# Patient Record
Sex: Female | Born: 1990 | Race: Asian | Hispanic: No | Marital: Single | Smoking: Never smoker
Health system: Southern US, Community
[De-identification: ages and names within clinical notes are randomized; demographics above are authoritative.]

## PROBLEM LIST (undated history)

## (undated) DIAGNOSIS — D649 Anemia, unspecified: Secondary | ICD-10-CM

## (undated) DIAGNOSIS — B999 Unspecified infectious disease: Secondary | ICD-10-CM

## (undated) DIAGNOSIS — G43909 Migraine, unspecified, not intractable, without status migrainosus: Secondary | ICD-10-CM

## (undated) DIAGNOSIS — K219 Gastro-esophageal reflux disease without esophagitis: Secondary | ICD-10-CM

---
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
## 2013-12-24 ENCOUNTER — Emergency Department (HOSPITAL_COMMUNITY)
Admission: EM | Admit: 2013-12-24 | Discharge: 2013-12-24 | Discharge status: Against Medical Advice | Payer: Medicaid Other | Attending: Emergency Medicine | Admitting: Emergency Medicine

## 2013-12-24 DIAGNOSIS — M069 Rheumatoid arthritis, unspecified: Secondary | ICD-10-CM | POA: Insufficient documentation

## 2013-12-24 DIAGNOSIS — R1110 Vomiting, unspecified: Secondary | ICD-10-CM | POA: Insufficient documentation

## 2013-12-24 DIAGNOSIS — R42 Dizziness and giddiness: Secondary | ICD-10-CM | POA: Insufficient documentation

## 2013-12-24 DIAGNOSIS — G43909 Migraine, unspecified, not intractable, without status migrainosus: Secondary | ICD-10-CM | POA: Insufficient documentation

## 2013-12-24 NOTE — ED Notes (Signed)
Patient left stating she was going home and would come back if she started feeling worse

## 2014-08-25 ENCOUNTER — Emergency Department (HOSPITAL_COMMUNITY)
Admission: EM | Admit: 2014-08-25 | Discharge: 2014-08-25 | Discharge status: Against Medical Advice | Payer: Medicaid Other | Attending: Emergency Medicine | Admitting: Emergency Medicine

## 2014-08-25 ENCOUNTER — Encounter (HOSPITAL_COMMUNITY): Payer: Self-pay | Admitting: Emergency Medicine

## 2014-08-25 DIAGNOSIS — R51 Headache: Secondary | ICD-10-CM | POA: Diagnosis not present

## 2014-08-25 HISTORY — DX: Migraine, unspecified, not intractable, without status migrainosus: G43.909

## 2014-08-25 NOTE — ED Notes (Signed)
Patient left without being seen due to wait. Patient encouraged to stay. Patient stated she would call her physician in the morning.

## 2014-08-25 NOTE — ED Notes (Signed)
Pt c/o frontal HA x 3 days with N/V

## 2019-03-19 ENCOUNTER — Other Ambulatory Visit: Payer: Self-pay | Admitting: *Deleted

## 2019-03-19 DIAGNOSIS — Z20822 Contact with and (suspected) exposure to covid-19: Secondary | ICD-10-CM

## 2019-03-20 LAB — NOVEL CORONAVIRUS, NAA: SARS-CoV-2, NAA: NOT DETECTED

## 2019-04-03 ENCOUNTER — Telehealth: Payer: Self-pay | Admitting: General Practice

## 2019-04-03 NOTE — Telephone Encounter (Signed)
Patient was advised of her negative COVID results. Patient expressed understanding. °

## 2019-06-05 ENCOUNTER — Ambulatory Visit (INDEPENDENT_AMBULATORY_CARE_PROVIDER_SITE_OTHER): Payer: Medicaid Other

## 2019-06-05 ENCOUNTER — Encounter (HOSPITAL_COMMUNITY): Payer: Self-pay

## 2019-06-05 ENCOUNTER — Other Ambulatory Visit: Payer: Self-pay

## 2019-06-05 ENCOUNTER — Ambulatory Visit (HOSPITAL_COMMUNITY)
Admission: EM | Admit: 2019-06-05 | Discharge: 2019-06-05 | Disposition: A | Discharge status: Home / Self Care | Payer: Medicaid Other | Attending: Family Medicine | Admitting: Family Medicine

## 2019-06-05 DIAGNOSIS — S62652A Nondisplaced fracture of medial phalanx of right middle finger, initial encounter for closed fracture: Secondary | ICD-10-CM | POA: Diagnosis not present

## 2019-06-05 DIAGNOSIS — Z3202 Encounter for pregnancy test, result negative: Secondary | ICD-10-CM

## 2019-06-05 LAB — POCT PREGNANCY, URINE: Preg Test, Ur: NEGATIVE

## 2019-06-05 NOTE — ED Triage Notes (Signed)
Pt states she fell a week ago on her tailbone. Pt jammed her middle finger as well ( right hand ).

## 2019-06-05 NOTE — Discharge Instructions (Addendum)
The x-ray shows a small chip fracture in the middle phalanx of your right middle finger.  This usually takes about 4 weeks to heal.  We would like you to follow-up with the hand surgeon indicated on this form.  You most likely will not need any surgery but rather just follow-up to make sure the bone is healing properly.

## 2019-06-05 NOTE — ED Provider Notes (Signed)
[facility]    CSN: [id number] Arrival date & time: 06/05/19  1615      History   Chief Complaint Chief Complaint  Patient presents with  . Fall    HPI Tricia Walter is a 28 y.o. female.   This is the initial visit for this 28 year old woman.  Patient had a fall and injured her right middle finger and left gluteal area.  She was at a wedding when she fell.  The right middle finger was hyperextended and swelled immediately.  After a while, the swelling subsided, but then she "jammed" it and has had pain in the PIP joint since with mild proximal middle finger STS.  She also fell on left sacral area and has tenderness and pain when sitting.     Past Medical History:  Diagnosis Date  . Migraine     There are no active problems to display for this patient.   History reviewed. No pertinent surgical history.  OB History   No obstetric history on file.      Home Medications    Prior to Admission medications   Not on File    Family History History reviewed. No pertinent family history.  Social History Social History   Tobacco Use  . Smoking status: Never Smoker  . Smokeless tobacco: Never Used  Substance Use Topics  . Alcohol use: Yes    Comment: occ  . Drug use: No     Allergies   Patient has no known allergies.   Review of Systems Review of Systems  Musculoskeletal: Positive for joint swelling.  All other systems reviewed and are negative.    Physical Exam Triage Vital Signs ED Triage Vitals  Enc Vitals Group     BP      Pulse      Resp      Temp      Temp src      SpO2      Weight      Height      Head Circumference      Peak Flow      Pain Score      Pain Loc      Pain Edu?      Excl. in [place]?    No data found.  Updated Vital Signs BP 114/72 (BP Location: Right Arm)   Pulse 76   Temp 98.7 F (37.1 C) (Oral)   Resp 16   Wt 70.3 kg   LMP 03/05/2019 (Exact Date) Comment: patient stated she had an abortion in  July/2020, has not had LMP since . Pregnancy test was Negative  SpO2 100%    Physical Exam Vitals signs and nursing note reviewed.  Constitutional:      Appearance: Normal appearance. She is normal weight.  HENT:     Head: Normocephalic.  Eyes:     Conjunctiva/sclera: Conjunctivae normal.  Neck:     Musculoskeletal: Normal range of motion and neck supple.  Pulmonary:     Effort: Pulmonary effort is normal.  Musculoskeletal: Normal range of motion.        General: Swelling, tenderness and signs of injury present. No deformity.     Comments: Right middle finger shows mild swelling and tenderness at the PIP joint bilaterally.  There is no ecchymosis or deformity.  Patient has tenderness at the insertion of the gluteus maximus along the left sacral edge.  There is no ecchymosis there.  Skin:    General: Skin is warm and  dry.     Findings: No bruising.  Neurological:     General: No focal deficit present.     Mental Status: She is alert and oriented to person, place, and time.  Psychiatric:        Mood and Affect: Mood normal.        Behavior: Behavior normal.        Thought Content: Thought content normal.        Judgment: Judgment normal.      UC Treatments / Results  Labs (all labs ordered are listed, but only abnormal results are displayed) Labs Reviewed  POCT PREGNANCY, URINE    EKG   Radiology No results found.  Procedures Procedures (including critical care time)  Medications Ordered in UC Medications - No data to display  Initial Impression / Assessment and Plan / UC Course  I have reviewed the triage vital signs and the nursing notes.  Pertinent labs & imaging results that were available during my care of the patient were reviewed by me and considered in my medical decision making (see chart for details).    Final Clinical Impressions(s) / UC Diagnoses   Final diagnoses:  Nondisplaced fracture of middle phalanx of right middle finger, initial  encounter for closed fracture     Discharge Instructions     The x-ray shows a small chip fracture in the middle phalanx of your right middle finger.  This usually takes about 4 weeks to heal.  We would like you to follow-up with the hand surgeon indicated on this form.  You most likely will not need any surgery but rather just follow-up to make sure the bone is healing properly.    ED Prescriptions    None     I have reviewed the PDMP during this encounter.   Elvina Sidle, MD 06/05/19 1750

## 2019-06-23 ENCOUNTER — Encounter (HOSPITAL_COMMUNITY): Payer: Self-pay | Admitting: Emergency Medicine

## 2019-06-23 ENCOUNTER — Emergency Department (HOSPITAL_COMMUNITY)
Admission: EM | Admit: 2019-06-23 | Discharge: 2019-06-23 | Disposition: A | Discharge status: Home / Self Care | Payer: Medicaid Other | Attending: Emergency Medicine | Admitting: Emergency Medicine

## 2019-06-23 DIAGNOSIS — R229 Localized swelling, mass and lump, unspecified: Secondary | ICD-10-CM

## 2019-06-23 DIAGNOSIS — R220 Localized swelling, mass and lump, head: Secondary | ICD-10-CM | POA: Insufficient documentation

## 2019-06-23 NOTE — ED Notes (Signed)
Pt given dc instructions pt verbalizes understanding.  

## 2019-06-23 NOTE — ED Triage Notes (Signed)
Pt here with a poss abscess on the back of her head on left side. Pt has been told for the past 4 years to get checked out and today she decided to get it looked at. Pt is in no pain.

## 2019-06-23 NOTE — Discharge Instructions (Signed)
As we discussed you will need follow-up evaluation with a primary care provider for ongoing evaluation and management of your swelling.  Is important that you make this follow-up visit within the next several weeks, please return to the emergency room if you develop any new or worsening signs or symptoms.

## 2019-06-23 NOTE — ED Provider Notes (Signed)
[facility] EMERGENCY DEPARTMENT Provider Note   CSN: [id number] Arrival date & time: 06/23/19  0908     History   Chief Complaint Chief Complaint  Patient presents with  . Abscess    HPI Tricia Walter is a 28 y.o. female.     HPI   28 year old female presents today with complaints of mass.  Patient notes that 4 years ago she noticed a soft lump on her scalp.  She notes this is unchanged is nonpainful.  She notes her family encouraged her to have it evaluated today, no significant changes.  Past Medical History:  Diagnosis Date  . Migraine     There are no active problems to display for this patient.   History reviewed. No pertinent surgical history.   OB History   No obstetric history on file.      Home Medications    Prior to Admission medications   Not on File    Family History No family history on file.  Social History Social History   Tobacco Use  . Smoking status: Never Smoker  . Smokeless tobacco: Never Used  Substance Use Topics  . Alcohol use: Yes    Comment: occ  . Drug use: No     Allergies   Patient has no known allergies.   Review of Systems Review of Systems  All other systems reviewed and are negative.    Physical Exam Updated Vital Signs BP 106/69 (BP Location: Right Arm)   Pulse 73   Temp 98.7 F (37.1 C) (Oral)   Resp 20   SpO2 99%   Physical Exam Vitals signs and nursing note reviewed.  Constitutional:      Appearance: She is well-developed.  HENT:     Head: Normocephalic and atraumatic.     Comments: Approximately 1 cm of soft tissue noted to the left posterior scalp, no overlying erythema, nontender Eyes:     General: No scleral icterus.       Right eye: No discharge.        Left eye: No discharge.     Conjunctiva/sclera: Conjunctivae normal.     Pupils: Pupils are equal, round, and reactive to light.  Neck:     Musculoskeletal: Normal range of motion.     Vascular: No JVD.   Trachea: No tracheal deviation.  Pulmonary:     Effort: Pulmonary effort is normal.     Breath sounds: No stridor.  Neurological:     Mental Status: She is alert and oriented to person, place, and time.     Coordination: Coordination normal.  Psychiatric:        Behavior: Behavior normal.        Thought Content: Thought content normal.        Judgment: Judgment normal.      ED Treatments / Results  Labs (all labs ordered are listed, but only abnormal results are displayed) Labs Reviewed - No data to display  EKG None  Radiology No results found.  Procedures Procedures (including critical care time)  Medications Ordered in ED Medications - No data to display   Initial Impression / Assessment and Plan / ED Course  I have reviewed the triage vital signs and the nursing notes.  Pertinent labs & imaging results that were available during my care of the patient were reviewed by me and considered in my medical decision making (see chart for details).        28 year old female presents today with soft  tissue swelling to her scalp.  This is been going on for 4 years it is unchanged is nonpainful.  Question lipoma.  I have instructed the patient to follow-up with primary care for reevaluation and ongoing management of this.  She agrees she will follow-up as an outpatient for reevaluation.  Final Clinical Impressions(s) / ED Diagnoses   Final diagnoses:  Soft tissue swelling    ED Discharge Orders    None       Okey Regal, PA-C 06/23/19 7416    Lajean Saver, MD 06/23/19 1027

## 2019-07-02 ENCOUNTER — Inpatient Hospital Stay: Payer: Medicaid Other

## 2019-07-03 ENCOUNTER — Emergency Department (HOSPITAL_BASED_OUTPATIENT_CLINIC_OR_DEPARTMENT_OTHER): Payer: Medicaid Other

## 2019-07-03 ENCOUNTER — Other Ambulatory Visit: Payer: Self-pay

## 2019-07-03 ENCOUNTER — Emergency Department (HOSPITAL_BASED_OUTPATIENT_CLINIC_OR_DEPARTMENT_OTHER)
Admission: EM | Admit: 2019-07-03 | Discharge: 2019-07-03 | Disposition: A | Discharge status: Home / Self Care | Payer: Medicaid Other | Attending: Emergency Medicine | Admitting: Emergency Medicine

## 2019-07-03 ENCOUNTER — Encounter (HOSPITAL_BASED_OUTPATIENT_CLINIC_OR_DEPARTMENT_OTHER): Payer: Self-pay | Admitting: *Deleted

## 2019-07-03 DIAGNOSIS — K219 Gastro-esophageal reflux disease without esophagitis: Secondary | ICD-10-CM | POA: Diagnosis not present

## 2019-07-03 DIAGNOSIS — R110 Nausea: Secondary | ICD-10-CM | POA: Diagnosis not present

## 2019-07-03 DIAGNOSIS — R197 Diarrhea, unspecified: Secondary | ICD-10-CM | POA: Insufficient documentation

## 2019-07-03 DIAGNOSIS — R1011 Right upper quadrant pain: Secondary | ICD-10-CM

## 2019-07-03 LAB — COMPREHENSIVE METABOLIC PANEL
ALT: 14 U/L (ref 0–44)
AST: 18 U/L (ref 15–41)
Albumin: 4.5 g/dL (ref 3.5–5.0)
Alkaline Phosphatase: 53 U/L (ref 38–126)
Anion gap: 11 (ref 5–15)
BUN: 12 mg/dL (ref 6–20)
CO2: 23 mmol/L (ref 22–32)
Calcium: 9 mg/dL (ref 8.9–10.3)
Chloride: 104 mmol/L (ref 98–111)
Creatinine, Ser: 0.76 mg/dL (ref 0.44–1.00)
GFR calc Af Amer: >60 mL/min (ref >60)
GFR calc non Af Amer: >60 mL/min (ref >60)
Glucose, Bld: 81 mg/dL (ref 70–99)
Potassium: 3.8 mmol/L (ref 3.5–5.1)
Sodium: 138 mmol/L (ref 135–145)
Total Bilirubin: 1.9 mg/dL — ABNORMAL HIGH (ref 0.3–1.2)
Total Protein: 8.3 g/dL — ABNORMAL HIGH (ref 6.5–8.1)

## 2019-07-03 LAB — CBC WITH DIFFERENTIAL/PLATELET
Abs Immature Granulocytes: 0.01 10*3/uL (ref 0.00–0.07)
Basophils Absolute: 0 10*3/uL (ref 0.0–0.1)
Basophils Relative: 0 %
Eosinophils Absolute: 0 10*3/uL (ref 0.0–0.5)
Eosinophils Relative: 0 %
HCT: 41.6 % (ref 36.0–46.0)
Hemoglobin: 13.6 g/dL (ref 12.0–15.0)
Immature Granulocytes: 0 %
Lymphocytes Relative: 30 %
Lymphs Abs: 1.6 10*3/uL (ref 0.7–4.0)
MCH: 31.3 pg (ref 26.0–34.0)
MCHC: 32.7 g/dL (ref 30.0–36.0)
MCV: 95.9 fL (ref 80.0–100.0)
Monocytes Absolute: 0.5 10*3/uL (ref 0.1–1.0)
Monocytes Relative: 10 %
Neutro Abs: 3.3 10*3/uL (ref 1.7–7.7)
Neutrophils Relative %: 60 %
Platelets: 240 10*3/uL (ref 150–400)
RBC: 4.34 MIL/uL (ref 3.87–5.11)
RDW: 12.6 % (ref 11.5–15.5)
WBC: 5.5 10*3/uL (ref 4.0–10.5)
nRBC: 0 % (ref 0.0–0.2)

## 2019-07-03 LAB — URINALYSIS, ROUTINE W REFLEX MICROSCOPIC
Bilirubin Urine: NEGATIVE
Glucose, UA: NEGATIVE mg/dL
Hgb urine dipstick: NEGATIVE
Ketones, ur: NEGATIVE mg/dL
Leukocytes,Ua: NEGATIVE
Nitrite: NEGATIVE
Protein, ur: NEGATIVE mg/dL
Specific Gravity, Urine: 1.02 (ref 1.005–1.030)
pH: 6.5 (ref 5.0–8.0)

## 2019-07-03 LAB — PREGNANCY, URINE: Preg Test, Ur: NEGATIVE

## 2019-07-03 LAB — LIPASE, BLOOD: Lipase: 35 U/L (ref 11–51)

## 2019-07-03 MED ORDER — FAMOTIDINE 20 MG PO TABS
20.0000 mg | ORAL_TABLET | Freq: Once | ORAL | Status: AC
  Administered 2019-07-03: 20 mg via ORAL
  Filled 2019-07-03: qty 1

## 2019-07-03 MED ORDER — ACETAMINOPHEN 500 MG PO TABS: 500.0000 mg | ORAL_TABLET | Freq: Four times a day (QID) | ORAL | 0 refills | Status: AC | PRN

## 2019-07-03 MED ORDER — ONDANSETRON HCL 4 MG PO TABS: 4.0000 mg | ORAL_TABLET | Freq: Three times a day (TID) | ORAL | 0 refills | Status: DC | PRN

## 2019-07-03 MED ORDER — IOHEXOL 300 MG/ML  SOLN
100.0000 mL | Freq: Once | INTRAMUSCULAR | Status: AC | PRN
  Administered 2019-07-03: 100 mL via INTRAVENOUS

## 2019-07-03 MED ORDER — LIDOCAINE VISCOUS HCL 2 % MT SOLN
15.0000 mL | Freq: Once | OROMUCOSAL | Status: AC
  Administered 2019-07-03: 15 mL via ORAL
  Filled 2019-07-03: qty 15

## 2019-07-03 MED ORDER — FAMOTIDINE 20 MG PO TABS: 20.0000 mg | ORAL_TABLET | Freq: Two times a day (BID) | ORAL | 0 refills | Status: DC

## 2019-07-03 MED ORDER — MORPHINE SULFATE (PF) 4 MG/ML IV SOLN
4.0000 mg | Freq: Once | INTRAVENOUS | Status: AC
  Administered 2019-07-03: 4 mg via INTRAVENOUS
  Filled 2019-07-03: qty 1

## 2019-07-03 MED ORDER — ONDANSETRON HCL 4 MG/2ML IJ SOLN
4.0000 mg | Freq: Once | INTRAMUSCULAR | Status: AC
  Administered 2019-07-03: 4 mg via INTRAVENOUS
  Filled 2019-07-03: qty 2

## 2019-07-03 MED ORDER — ALUM & MAG HYDROXIDE-SIMETH 200-200-20 MG/5ML PO SUSP
30.0000 mL | Freq: Once | ORAL | Status: AC
  Administered 2019-07-03: 30 mL via ORAL
  Filled 2019-07-03: qty 30

## 2019-07-03 NOTE — Discharge Instructions (Signed)
1. Medications: Take Zofran as needed for nausea.  This medicine dissolve under your tongue and wait around 10 to 20 minutes before eating or drinking after taking this medication.  Take Pepcid twice daily with meals.  Can take Tylenol 1 to 2 tablets every 6 hours as needed for pain. 2. Treatment: rest, drink plenty of fluids, advance diet slowly.  Eat a diet of bland foods that will not upset your stomach.  Avoid spicy foods, fried foods, fatty foods, or alcohol. 3. Follow Up: Please followup with your primary doctor or gastroenterologist in 3 days for discussion of your diagnoses and further evaluation after today's visit; if you do not have a primary care doctor use the resource guide provided to find one; Please return to the ER for persistent vomiting, high fevers or worsening symptoms

## 2019-07-03 NOTE — ED Notes (Signed)
ED Provider at bedside. 

## 2019-07-03 NOTE — ED Triage Notes (Signed)
She was seen at UC this am for epigastric pain. Stiffness between her shoulder blades. She was sent here to r.o gallstones.

## 2019-07-03 NOTE — ED Provider Notes (Signed)
[facility] EMERGENCY DEPARTMENT Provider Note   CSN: [id number] Arrival date & time: 07/03/19  1210     History   Chief Complaint Chief Complaint  Patient presents with   Abdominal Pain    HPI Tricia Walter is a 28 y.o. female with history of migraines presents sent from urgent care for evaluation of acute onset, progressively worsening right upper quadrant abdominal pain.  Reports symptoms began yesterday evening around 10 PM primarily with nausea but some right upper quadrant abdominal discomfort.  She took an over-the-counter nausea medication and ibuprofen and was able to fall asleep but when she awoke this morning her pain had significantly worsened.  She notes a constant pain which is sometimes sharp but will subside intermittently.  Pain does not radiate but she also notes some discomfort to her right shoulder blade.  Denies vomiting, diarrhea or constipation but she did have some soft stools last night.  Denies melena, hematochezia, hematemesis, urinary symptoms, shortness of breath, or chest pain.  No fevers or chills.  Was seen at urgent care earlier today and was sent to the ED for evaluation of possible gallstones or other hepatobiliary disease.     The history is provided by the patient.    Past Medical History:  Diagnosis Date   Migraine     There are no active problems to display for this patient.   Past Surgical History:  Procedure Laterality Date   CESAREAN SECTION       OB History   No obstetric history on file.      Home Medications    Prior to Admission medications   Medication Sig Start Date End Date Taking? Authorizing Provider  acetaminophen (TYLENOL) 500 MG tablet Take 1 tablet (500 mg total) by mouth every 6 (six) hours as needed. 07/03/19   Zadiel Leyh A, PA-C  famotidine (PEPCID) 20 MG tablet Take 1 tablet (20 mg total) by mouth 2 (two) times daily. 07/03/19   Torrence Branagan A, PA-C  ondansetron (ZOFRAN) 4 MG tablet Take 1  tablet (4 mg total) by mouth every 8 (eight) hours as needed for nausea or vomiting. 07/03/19   Jeanie SewerFawze, Christella App A, PA-C    Family History No family history on file.  Social History Social History   Tobacco Use   Smoking status: Never Smoker   Smokeless tobacco: Never Used  Substance Use Topics   Alcohol use: Yes    Comment: occ   Drug use: No     Allergies   Patient has no known allergies.   Review of Systems Review of Systems  Constitutional: Negative for chills and fever.  Respiratory: Negative for shortness of breath.   Cardiovascular: Negative for chest pain.  Gastrointestinal: Positive for abdominal pain, diarrhea and nausea. Negative for constipation and vomiting.  Genitourinary: Negative for dysuria, frequency, hematuria and urgency.  All other systems reviewed and are negative.    Physical Exam Updated Vital Signs BP 104/65 (BP Location: Right Arm)    Pulse 77    Temp 99.3 F (37.4 C) (Oral)    Resp 16    Ht 5\' 3"  (1.6 m)    Wt 68 kg    LMP 06/19/2019    SpO2 100%    BMI 26.57 kg/m   Physical Exam Vitals signs and nursing note reviewed.  Constitutional:      General: She is not in acute distress.    Appearance: She is well-developed.  HENT:     Head: Normocephalic and atraumatic.  Eyes:     General:        Right eye: No discharge.        Left eye: No discharge.     Conjunctiva/sclera: Conjunctivae normal.  Neck:     Vascular: No JVD.     Trachea: No tracheal deviation.  Cardiovascular:     Rate and Rhythm: Normal rate and regular rhythm.  Pulmonary:     Effort: Pulmonary effort is normal.     Breath sounds: Normal breath sounds.     Comments: Mild tenderness to palpation of the right anterior/inferior chest wall with no deformity, crepitus, ecchymosis, or flail segment. Chest:    Abdominal:     General: Abdomen is flat. Bowel sounds are normal. There is no distension.     Palpations: Abdomen is soft.     Tenderness: There is abdominal  tenderness in the right upper quadrant. There is no right CVA tenderness, left CVA tenderness, guarding or rebound. Positive signs include Murphy's sign.  Skin:    General: Skin is warm and dry.     Findings: No erythema.  Neurological:     Mental Status: She is alert.  Psychiatric:        Behavior: Behavior normal.      ED Treatments / Results  Labs (all labs ordered are listed, but only abnormal results are displayed) Labs Reviewed  COMPREHENSIVE METABOLIC PANEL - Abnormal; Notable for the following components:      Result Value   Total Protein 8.3 (*)    Total Bilirubin 1.9 (*)    All other components within normal limits  PREGNANCY, URINE  CBC WITH DIFFERENTIAL/PLATELET  LIPASE, BLOOD  URINALYSIS, ROUTINE W REFLEX MICROSCOPIC    EKG None  Radiology Dg Chest 2 View  Result Date: 07/03/2019 CLINICAL DATA:  28 year old female with history of right-sided chest pain. Nausea and vomiting for 1-2 days. EXAM: CHEST - 2 VIEW COMPARISON:  No priors. FINDINGS: Lung volumes are normal. No consolidative airspace disease. No pleural effusions. No pneumothorax. No pulmonary nodule or mass noted. Pulmonary vasculature and the cardiomediastinal silhouette are within normal limits. Bilateral nipple rings are incidentally noted. IMPRESSION: 1.  No radiographic evidence of acute cardiopulmonary disease. Electronically Signed   By: Vinnie Langton M.D.   On: 07/03/2019 16:08   Ct Abdomen Pelvis W Contrast  Result Date: 07/03/2019 CLINICAL DATA:  Abdominal pain, primarily right-sided.  Nausea. EXAM: CT ABDOMEN AND PELVIS WITH CONTRAST TECHNIQUE: Multidetector CT imaging of the abdomen and pelvis was performed using the standard protocol following bolus administration of intravenous contrast. Oral contrast was also administered. CONTRAST:  141mL OMNIPAQUE IOHEXOL 300 MG/ML  SOLN COMPARISON:  Ultrasound right upper quadrant July 03, 2019 FINDINGS: Lower chest: There is slight bibasilar  atelectatic change. There is no edema or consolidation. Oral contrast is seen in the distal esophagus. Hepatobiliary: No focal liver lesions are appreciable. The gallbladder wall does not appear appreciably thickened. There is no biliary duct dilatation. Pancreas: There is no pancreatic mass or inflammatory focus. Spleen: No splenic lesions are evident. Adrenals/Urinary Tract: Adrenals bilaterally appear unremarkable. Kidneys bilaterally show no evident mass or hydronephrosis. There is no evident renal or ureteral calculus on either side. Urinary bladder is midline with wall thickness within normal limits. Stomach/Bowel: There is no appreciable bowel wall or mesenteric thickening. There is no appreciable bowel obstruction. The terminal ileum appears unremarkable. There is no demonstrable free air or portal venous air. Vascular/Lymphatic: There is no abdominal aortic aneurysm. No vascular lesions are  evident. There is no appreciable adenopathy in the abdomen or pelvis. Reproductive: The uterus is anteverted. There is no evident pelvic mass. There is trace free fluid in the cul-de-sac. Other: Appendix appears normal. There is no abscess in the abdomen pelvis. There is no ascites beyond trace fluid in the cul-de-sac region. Musculoskeletal: There are no blastic or lytic bone lesions. There is no intramuscular or abdominal wall lesion. IMPRESSION: 1. Oral contrast is seen in the distal esophagus, a finding indicative of a degree of gastroesophageal reflux. 2. No bowel wall thickening or bowel obstruction evident. No abscess in the abdomen or pelvis. Appendix appears normal. 3.  Gallbladder appears unremarkable by CT. 4.  Trace free fluid in the cul-de-sac which may be physiologic. 5. No renal or ureteral calculus. No hydronephrosis. Urinary bladder wall thickness is within normal limits. Electronically Signed   By: Bretta BangWilliam  Woodruff III M.D.   On: 07/03/2019 16:05   [place] Abdomen Limited Ruq  Result Date:  07/03/2019 CLINICAL DATA:  RIGHT upper quadrant pain for 1 day. Intermittent pain EXAM: ULTRASOUND ABDOMEN LIMITED RIGHT UPPER QUADRANT COMPARISON:  None. FINDINGS: Gallbladder: No gallstones or wall thickening visualized. No sonographic Murphy sign noted by sonographer. Common bile duct: Diameter: 2.4 mm Liver: No focal lesion identified. Within normal limits in parenchymal echogenicity. Portal vein is patent on color Doppler imaging with normal direction of blood flow towards the liver. Other: No ascites IMPRESSION: Normal RIGHT upper quadrant ultrasound. Electronically Signed   By: Genevive BiStewart  Edmunds M.D.   On: 07/03/2019 14:10     Procedures Procedures (including critical care time)  Medications Ordered in ED Medications  ondansetron (ZOFRAN) injection 4 mg (4 mg Intravenous Given 07/03/19 1329)  morphine 4 MG/ML injection 4 mg (4 mg Intravenous Given 07/03/19 1329)  iohexol (OMNIPAQUE) 300 MG/ML solution 100 mL (100 mLs Intravenous Contrast Given 07/03/19 1543)  alum & mag hydroxide-simeth (MAALOX/MYLANTA) 200-200-20 MG/5ML suspension 30 mL (30 mLs Oral Given 07/03/19 1626)    And  lidocaine (XYLOCAINE) 2 % viscous mouth solution 15 mL (15 mLs Oral Given 07/03/19 1626)  famotidine (PEPCID) tablet 20 mg (20 mg Oral Given 07/03/19 1626)     Initial Impression / Assessment and Plan / ED Course  I have reviewed the triage vital signs and the nursing notes.  Pertinent labs & imaging results that were available during my care of the patient were reviewed by me and considered in my medical decision making (see chart for details).        Patient presenting for evaluation of right upper quadrant abdominal pain and right chest wall pain with associated nausea.  She is afebrile, vital signs are stable.  She is nontoxic in appearance.  No peritoneal signs on examination of the abdomen.  Sent from urgent care to rule out hepatobiliary dysfunction.  Lab work reviewed by me shows no leukocytosis, no  anemia, no metabolic derangements, no renal insufficiency.  Total bilirubin is very mildly elevated but remainder of LFTs are within normal limits.  Lipase is within normal limits.  UA does not suggest UTI or nephrolithiasis.  Imaging today shows no evidence of acute surgical abdominal pathology, no evidence of obstruction, perforation, appendicitis, cholecystitis.  No lower abdominal pain or GU complaints to suggest ectopic pregnancy, PID, ovarian torsion, or TOA.  Mild chest wall tenderness, could be costochondritis or musculoskeletal in etiology, doubt PE, ACS or MI.  On reevaluation patient is resting comfortably no apparent distress.  She reports she is feeling better and is tolerating  p.o. food and fluids without difficulty.  Serial abdominal examinations are benign.  No further emergent work-up required at this time.  Recommend follow-up with PCP for reevaluation of symptoms.  Discussed strict ED return precautions. Patient verbalized understanding of and agreement with plan and is safe for discharge home at this time.   Final Clinical Impressions(s) / ED Diagnoses   Final diagnoses:  RUQ pain  Gastroesophageal reflux disease, unspecified whether esophagitis present    ED Discharge Orders         Ordered    ondansetron (ZOFRAN) 4 MG tablet  Every 8 hours PRN     07/03/19 1655    famotidine (PEPCID) 20 MG tablet  2 times daily     07/03/19 1655    acetaminophen (TYLENOL) 500 MG tablet  Every 6 hours PRN     07/03/19 1655           Nohea Kras, [place] A, PA-C 07/07/19 1348    Alvira Monday, MD 07/11/19 1310

## 2019-07-22 ENCOUNTER — Other Ambulatory Visit: Payer: Self-pay

## 2019-07-22 ENCOUNTER — Ambulatory Visit: Payer: Medicaid Other | Admitting: Critical Care Medicine

## 2019-07-22 NOTE — Progress Notes (Deleted)
Patient ID: Tricia Walter, female   DOB: [birth year], 28 y.o.   MRN: [id number] Virtual Visit via Telephone Note  I connected with Tricia Walter on 07/22/19 at  9:00 AM EST by telephone and verified that I am speaking with the correct person using two identifiers.   Consent:  I discussed the limitations, risks, security and privacy concerns of performing an evaluation and management service by telephone and the availability of in person appointments. I also discussed with the patient that there may be a patient responsible charge related to this service. The patient expressed understanding and agreed to proceed.  Location of patient:  Location of provider:  Persons participating in the televisit with the patient.     History of Present Illness: Pt in ED 10/15: Tricia Walter is a 28 y.o. female with history of migraines presents sent from urgent care for evaluation of acute onset, progressively worsening right upper quadrant abdominal pain.  Reports symptoms began yesterday evening around 10 PM primarily with nausea but some right upper quadrant abdominal discomfort.  She took an over-the-counter nausea medication and ibuprofen and was able to fall asleep but when she awoke this morning her pain had significantly worsened.  She notes a constant pain which is sometimes sharp but will subside intermittently.  Pain does not radiate but she also notes some discomfort to her right shoulder blade.  Denies vomiting, diarrhea or constipation but she did have some soft stools last night.  Denies melena, hematochezia, hematemesis, urinary symptoms, shortness of breath, or chest pain.  No fevers or chills.  Was seen at urgent care earlier today and was sent to the ED for evaluation of possible gallstones or other hepatobiliary disease.   Patient presenting for evaluation of right upper quadrant abdominal pain and right chest wall pain with associated nausea.  She is afebrile, vital signs are stable.  She  is nontoxic in appearance.  No peritoneal signs on examination of the abdomen.  Sent from urgent care to rule out hepatobiliary dysfunction.  Lab work reviewed by me shows no leukocytosis, no anemia, no metabolic derangements, no renal insufficiency.  Total bilirubin is very mildly elevated but remainder of LFTs are within normal limits.  Lipase is within normal limits.  UA does not suggest UTI or nephrolithiasis.  Imaging today shows no evidence of acute surgical abdominal pathology, no evidence of obstruction, perforation, appendicitis, cholecystitis.  No lower abdominal pain or GU complaints to suggest ectopic pregnancy, PID, ovarian torsion, or TOA.  Mild chest wall tenderness, could be costochondritis or musculoskeletal in etiology, doubt PE, ACS or MI.  On reevaluation patient is resting comfortably no apparent distress.  She reports she is feeling better and is tolerating p.o. food and fluids without difficulty.  Serial abdominal examinations are benign.  No further emergent work-up required at this time.  Recommend follow-up with PCP for reevaluation of symptoms.  Discussed strict ED return precautions. Patient verbalized understanding of and agreement with plan and is safe for discharge home at this time.   Rx pepcid/ zofran 8mg   Tylenol   F/u Telephone visit:  Saw PCP [facility] 10/30  Rx OmepraZole       Constitutional:   No  weight loss, night sweats,  Fevers, chills, fatigue, lassitude. HEENT:   No headaches,  Difficulty swallowing,  Tooth/dental problems,  Sore throat,                No sneezing, itching, ear ache, nasal congestion, post nasal drip,  CV:  No chest pain,  Orthopnea, PND, swelling in lower extremities, anasarca, dizziness, palpitations  GI  No heartburn, indigestion, abdominal pain, nausea, vomiting, diarrhea, change in bowel habits, loss of appetite  Resp: No shortness of breath with exertion or at rest.  No excess mucus, no productive cough,  No  non-productive cough,  No coughing up of blood.  No change in color of mucus.  No wheezing.  No chest wall deformity  Skin: no rash or lesions.  GU: no dysuria, change in color of urine, no urgency or frequency.  No flank pain.  MS:  No joint pain or swelling.  No decreased range of motion.  No back pain.  Psych:  No change in mood or affect. No depression or anxiety.  No memory loss.  Observations/Objective: CT Abd 10/15: IMPRESSION: 1. Oral contrast is seen in the distal esophagus, a finding indicative of a degree of gastroesophageal reflux.  2. No bowel wall thickening or bowel obstruction evident. No abscess in the abdomen or pelvis. Appendix appears normal.  3. Gallbladder appears unremarkable by CT.  4. Trace free fluid in the cul-de-sac which may be physiologic.  5. No renal or ureteral calculus. No hydronephrosis. Urinary bladder wall thickness is within normal limits.   U/s RUQ 10/15:  IMPRESSION: Normal RIGHT upper quadrant ultrasound.   CXR 10/15: NAD   CMET /CBC Normal   Lipase Normal   Assessment and Plan:   Follow Up Instructions:    I discussed the assessment and treatment plan with the patient. The patient was provided an opportunity to ask questions and all were answered. The patient agreed with the plan and demonstrated an understanding of the instructions.   The patient was advised to call back or seek an in-person evaluation if the symptoms worsen or if the condition fails to improve as anticipated.  I provided *** minutes of non-face-to-face time during this encounter  including  median intraservice time , review of notes, labs, imaging, medications  and explaining diagnosis and management to the patient .    Shan Levans, MD

## 2019-07-24 ENCOUNTER — Inpatient Hospital Stay: Payer: Medicaid Other | Admitting: Family Medicine

## 2020-04-08 ENCOUNTER — Inpatient Hospital Stay (HOSPITAL_COMMUNITY)
Admission: EM | Admit: 2020-04-08 | Discharge: 2020-04-08 | Disposition: A | Discharge status: Home / Self Care | Payer: BLUE CROSS/BLUE SHIELD | Attending: Obstetrics & Gynecology | Admitting: Obstetrics & Gynecology

## 2020-04-08 ENCOUNTER — Encounter (HOSPITAL_COMMUNITY): Payer: Self-pay | Admitting: Obstetrics & Gynecology

## 2020-04-08 ENCOUNTER — Inpatient Hospital Stay (HOSPITAL_COMMUNITY): Payer: BLUE CROSS/BLUE SHIELD

## 2020-04-08 ENCOUNTER — Other Ambulatory Visit: Payer: Self-pay

## 2020-04-08 DIAGNOSIS — Z79899 Other long term (current) drug therapy: Secondary | ICD-10-CM | POA: Diagnosis not present

## 2020-04-08 DIAGNOSIS — Z3491 Encounter for supervision of normal pregnancy, unspecified, first trimester: Secondary | ICD-10-CM

## 2020-04-08 DIAGNOSIS — O208 Other hemorrhage in early pregnancy: Secondary | ICD-10-CM | POA: Insufficient documentation

## 2020-04-08 DIAGNOSIS — R109 Unspecified abdominal pain: Secondary | ICD-10-CM | POA: Diagnosis not present

## 2020-04-08 DIAGNOSIS — Z3A08 8 weeks gestation of pregnancy: Secondary | ICD-10-CM

## 2020-04-08 DIAGNOSIS — O26891 Other specified pregnancy related conditions, first trimester: Secondary | ICD-10-CM

## 2020-04-08 DIAGNOSIS — O26899 Other specified pregnancy related conditions, unspecified trimester: Secondary | ICD-10-CM

## 2020-04-08 DIAGNOSIS — R110 Nausea: Secondary | ICD-10-CM

## 2020-04-08 LAB — URINALYSIS, ROUTINE W REFLEX MICROSCOPIC
Bilirubin Urine: NEGATIVE
Glucose, UA: NEGATIVE mg/dL
Hgb urine dipstick: NEGATIVE
Ketones, ur: NEGATIVE mg/dL
Leukocytes,Ua: NEGATIVE
Nitrite: NEGATIVE
Protein, ur: NEGATIVE mg/dL
Specific Gravity, Urine: 1.017 (ref 1.005–1.030)
pH: 6 (ref 5.0–8.0)

## 2020-04-08 LAB — CBC
HCT: 37.7 % (ref 36.0–46.0)
Hemoglobin: 12.6 g/dL (ref 12.0–15.0)
MCH: 31 pg (ref 26.0–34.0)
MCHC: 33.4 g/dL (ref 30.0–36.0)
MCV: 92.6 fL (ref 80.0–100.0)
Platelets: 212 10*3/uL (ref 150–400)
RBC: 4.07 MIL/uL (ref 3.87–5.11)
RDW: 12.8 % (ref 11.5–15.5)
WBC: 7 10*3/uL (ref 4.0–10.5)
nRBC: 0 % (ref 0.0–0.2)

## 2020-04-08 LAB — WET PREP, GENITAL
Sperm: NONE SEEN
Trich, Wet Prep: NONE SEEN
Yeast Wet Prep HPF POC: NONE SEEN

## 2020-04-08 LAB — HCG, QUANTITATIVE, PREGNANCY: hCG, Beta Chain, Quant, S: 146891 m[IU]/mL — ABNORMAL HIGH (ref <5)

## 2020-04-08 LAB — POC URINE PREG, ED: Preg Test, Ur: POSITIVE — AB

## 2020-04-08 LAB — ABO/RH: ABO/RH(D): O POS

## 2020-04-08 MED ORDER — PROMETHAZINE HCL 25 MG PO TABS: 25.0000 mg | ORAL_TABLET | Freq: Four times a day (QID) | ORAL | 0 refills | Status: DC | PRN

## 2020-04-08 NOTE — ED Triage Notes (Signed)
Pt arrives to ED w/ 8/10 abdominal cramping. Pt endorses intermittent vaginal bleeding over the last 7 weeks. Pt is [redacted] weeks pregnant.

## 2020-04-08 NOTE — Discharge Instructions (Signed)
Return to care   If you have heavier bleeding that soaks through more that 2 pads per hour for an hour or more  If you bleed so much that you feel like you might pass out or you do pass out  If you have significant abdominal pain that is not improved with Tylenol       Safe Medications in Pregnancy   Acne: Benzoyl Peroxide Salicylic Acid  Backache/Headache: Tylenol: 2 regular strength every 4 hours OR              2 Extra strength every 6 hours  Colds/Coughs/Allergies: Benadryl (alcohol free) 25 mg every 6 hours as needed Breath right strips Claritin Cepacol throat lozenges Chloraseptic throat spray Cold-Eeze- up to three times per day Cough drops, alcohol free Flonase (by prescription only) Guaifenesin Mucinex Robitussin DM (plain only, alcohol free) Saline nasal spray/drops Sudafed (pseudoephedrine) & Actifed ** use only after [redacted] weeks gestation and if you do not have high blood pressure Tylenol Vicks Vaporub Zinc lozenges Zyrtec   Constipation: Colace Ducolax suppositories Fleet enema Glycerin suppositories Metamucil Milk of magnesia Miralax Senokot Smooth move tea  Diarrhea: Kaopectate Imodium A-D  *NO pepto Bismol  Hemorrhoids: Anusol Anusol HC Preparation H Tucks  Indigestion: Tums Maalox Mylanta Zantac  Pepcid  Insomnia: Benadryl (alcohol free) 25mg  every 6 hours as needed Tylenol PM Unisom, no Gelcaps  Leg Cramps: Tums MagGel  Nausea/Vomiting:  Bonine Dramamine Emetrol Ginger extract Sea bands Meclizine  Nausea medication to take during pregnancy:  Unisom (doxylamine succinate 25 mg tablets) Take one tablet daily at bedtime. If symptoms are not adequately controlled, the dose can be increased to a maximum recommended dose of two tablets daily (1/2 tablet in the morning, 1/2 tablet mid-afternoon and one at bedtime). Vitamin B6 100mg  tablets. Take one tablet twice a day (up to 200 mg per day).  Skin Rashes: Aveeno  products Benadryl cream or 25mg  every 6 hours as needed Calamine Lotion 1% cortisone cream  Yeast infection: Gyne-lotrimin 7 Monistat 7  Gum/tooth pain: Anbesol  **If taking multiple medications, please check labels to avoid duplicating the same active ingredients **take medication as directed on the label ** Do not exceed 4000 mg of tylenol in 24 hours **Do not take medications that contain aspirin or ibuprofen     [facility] for at [facility]  [address] [place], [facility] [address]  [phone]  Center for [facility] at [facility] [place]  [address], [place], [facility] [address]  [phone]  Center for [facility] Healthcare at [facility] [address] [place], [facility] [address]  [phone]  Center for [facility] at [facility]  [address] [facility] [place], [address] [place]  [phone]  Center for [facility] at [facility]  [address] (First floor), [place], [facility] [address]  [place]  Center for [facility] at [facility] [id number] [address], [place], [facility] [place] [place] [phone]  Center for [facility] Healthcare at [facility]  [address] [place], [place], [address] [place]  [phone]  [facility]  [address], [place], [facility] [address]  [phone]  [facility]  [address] [place], [place], [address] [place]  [phone]  [facility]  [address] [phone] [place], [address] [place]  [phone]  [facility]  [address], [place], [facility] [address]  [phone]  [facility]  [address] Adriana Simas [place], [place] [address]  [phone]  [facility]   [address] [place], [place] [address]  [phone]  [facility]  [address], [place], [place] [address]   [phone]  [facility]  [address], [place], [place] [address]  [phone]

## 2020-04-08 NOTE — MAU Provider Note (Signed)
Chief Complaint: Abdominal Pain   First Provider Initiated Contact with Patient 04/08/20 1304     SUBJECTIVE HPI: Tricia Walter is a 29 y.o. G2P1001 at [redacted]w[redacted]d who presents to [facility] Admissions reporting abdominal cramping. Symptoms have been present intermittently for the last few weeks. Initially pain was in LLQ but now pain is throughout her lower abdomen. Reports some nausea but no vomiting.  Denies vomiting, diarrhea, constipation, dysuria, vaginal bleeding, or vaginal discharge. [facility]. Hasn't started prenatal care yet.  Location: abdomen Quality: cramping Severity: 8/10 on pain scale Duration: 2 weeks Timing: intermittent Modifying factors: none Associated signs and symptoms: none  Past Medical History:  Diagnosis Date  . Migraine    OB History  Gravida Para Term Preterm AB Living  2 1 1     1   SAB TAB Ectopic Multiple Live Births          1    # Outcome Date GA Lbr Len/2nd Weight Sex Delivery Anes PTL Lv  2 Current           1 Term 07/30/10 [redacted]w[redacted]d   F CS-LTranv   LIV   Past Surgical History:  Procedure Laterality Date  . CESAREAN SECTION     Social History   Socioeconomic History  . Marital status: Single    Spouse name: Not on file  . Number of children: Not on file  . Years of education: Not on file  . Highest education level: Not on file  Occupational History  . Not on file  Tobacco Use  . Smoking status: Never Smoker  . Smokeless tobacco: Never Used  Substance and Sexual Activity  . Alcohol use: Yes    Comment: occ  . Drug use: No  . Sexual activity: Not on file  Other Topics Concern  . Not on file  Social History Narrative  . Not on file   Social Determinants of Health   Financial Resource Strain:   . Difficulty of Paying Living Expenses:   Food Insecurity:   . Worried About [redacted]w[redacted]d in the Last Year:   . Programme researcher, broadcasting/film/video in the Last Year:   Transportation Needs:    . Barista (Medical):   Freight forwarder Lack of Transportation (Non-Medical):   Physical Activity:   . Days of Exercise per Week:   . Minutes of Exercise per Session:   Stress:   . Feeling of Stress :   Social Connections:   . Frequency of Communication with Friends and Family:   . Frequency of Social Gatherings with Friends and Family:   . Attends Religious Services:   . Active Member of Clubs or Organizations:   . Attends Marland [employer] Meetings:   Banker Marital Status:   Intimate Partner Violence:   . Fear of Current or Ex-Partner:   . Emotionally Abused:   [place] Physically Abused:   . Sexually Abused:    History reviewed. No pertinent family history. No current facility-administered medications on file prior to encounter.   Current Outpatient Medications on File Prior to Encounter  Medication Sig Dispense Refill  . acetaminophen (TYLENOL) 500 MG tablet Take 1 tablet (500 mg total) by mouth every 6 (six) hours as needed. 30 tablet 0  . famotidine (PEPCID) 20 MG tablet Take 1 tablet (20 mg total) by mouth 2 (two) times daily. 30 tablet 0  . ondansetron (ZOFRAN) 4 MG tablet Take 1 tablet (4 mg  total) by mouth every 8 (eight) hours as needed for nausea or vomiting. 10 tablet 0   No Known Allergies  I have reviewed patient's Past Medical Hx, Surgical Hx, Family Hx, Social Hx, medications and allergies.   Review of Systems  Constitutional: Negative.   Gastrointestinal: Positive for abdominal pain. Negative for constipation, diarrhea, nausea and vomiting.  Genitourinary: Negative.     OBJECTIVE Patient Vitals for the past 24 hrs:  BP Temp Temp src Pulse Resp SpO2 Height Weight  04/08/20 1251 104/65 98.3 F (36.8 C) Oral 69 18 100 % 5' 3.75" (1.619 m) 73 kg  04/08/20 1123 (!) 126/61 98.3 F (36.8 C) Oral 81 20 98 % 5\' 3"  (1.6 m) 70.3 kg   Constitutional: Well-developed, well-nourished female in no acute distress.  Cardiovascular: normal rate & rhythm, no  murmur Respiratory: normal rate and effort. Lung sounds clear throughout GI: Abd soft, non-tender, Pos BS x 4. No guarding or rebound tenderness MS: Extremities nontender, no edema, normal ROM Neurologic: Alert and oriented x 4.  GU:  NEFG. No blood. Cervix closed.   LAB RESULTS Results for orders placed or performed during the hospital encounter of 04/08/20 (from the past 24 hour(s))  POC Urine Pregnancy, ED (not at [facility])     Status: Abnormal   Collection Time: 04/08/20 11:31 AM  Result Value Ref Range   Preg Test, Ur POSITIVE (A) NEGATIVE  Urinalysis, Routine w reflex microscopic     Status: None   Collection Time: 04/08/20  1:11 PM  Result Value Ref Range   Color, Urine YELLOW YELLOW   APPearance CLEAR CLEAR   Specific Gravity, Urine 1.017 1.005 - 1.030   pH 6.0 5.0 - 8.0   Glucose, UA NEGATIVE NEGATIVE mg/dL   Hgb urine dipstick NEGATIVE NEGATIVE   Bilirubin Urine NEGATIVE NEGATIVE   Ketones, ur NEGATIVE NEGATIVE mg/dL   Protein, ur NEGATIVE NEGATIVE mg/dL   Nitrite NEGATIVE NEGATIVE   Leukocytes,Ua NEGATIVE NEGATIVE  Wet prep, genital     Status: Abnormal   Collection Time: 04/08/20  1:22 PM   Specimen: Vaginal  Result Value Ref Range   Yeast Wet Prep HPF POC NONE SEEN NONE SEEN   Trich, Wet Prep NONE SEEN NONE SEEN   Clue Cells Wet Prep HPF POC PRESENT (A) NONE SEEN   WBC, Wet Prep HPF POC MODERATE (A) NONE SEEN   Sperm NONE SEEN   CBC     Status: None   Collection Time: 04/08/20  1:30 PM  Result Value Ref Range   WBC 7.0 4.0 - 10.5 K/uL   RBC 4.07 3.87 - 5.11 MIL/uL   Hemoglobin 12.6 12.0 - 15.0 g/dL   HCT 19.3 36 - 46 %   MCV 92.6 80.0 - 100.0 fL   MCH 31.0 26.0 - 34.0 pg   MCHC 33.4 30.0 - 36.0 g/dL   RDW 79.0 24.0 - 97.3 %   Platelets 212 150 - 400 K/uL   nRBC 0.0 0.0 - 0.2 %  hCG, quantitative, pregnancy     Status: Abnormal   Collection Time: 04/08/20  1:30 PM  Result Value Ref Range   hCG, Beta Chain, Quant, S 146,891 (H) <5 mIU/mL  ABO/Rh     Status:  None (Preliminary result)   Collection Time: 04/08/20  1:32 PM  Result Value Ref Range   ABO/RH(D) O POS    No rh immune globuloin      NOT A RH IMMUNE GLOBULIN CANDIDATE, PT RH POSITIVE Performed at  [facility], [address], [place], [place] [address]     IMAGING [place] Comp Less 14 Wks  Result Date: 04/08/2020 CLINICAL DATA:  Cramping EXAM: OBSTETRIC <14 WK ULTRASOUND TECHNIQUE: Transabdominal ultrasound was performed for evaluation of the gestation as well as the maternal uterus and adnexal regions. COMPARISON:  None. FINDINGS: Intrauterine gestational sac: Single Yolk sac:  Visualized. Embryo:  Visualized. Cardiac Activity: Visualized. Heart Rate: 144 bpm CRL: 10.44 mm   7 w 1 d                  [place] EDC: 11/24/2020 Subchorionic hemorrhage: Two small subchronic hemorrhages identified. Maternal uterus/adnexae: Unremarkable.  No free fluid. IMPRESSION: Single live intrauterine pregnancy as described. Electronically Signed   By: Guadlupe Spanish M.D.   On: 04/08/2020 14:04    MAU COURSE Orders Placed This Encounter  Procedures  . Wet prep, genital  . US OB Comp Less 14 Wks  . Urinalysis, Routine w reflex microscopic  . CBC  . hCG, quantitative, pregnancy  . POC Urine Pregnancy, ED (not at [facility])  . ABO/Rh  . Discharge patient   Meds ordered this encounter  Medications  . promethazine (PHENERGAN) 25 MG tablet    Sig: Take 1 tablet (25 mg total) by mouth every 6 (six) hours as needed for nausea or vomiting.    Dispense:  30 tablet    Refill:  0    Order Specific Question:   Supervising Provider    Answer:   Adam Phenix [[id number]]    MDM +UPT UA, wet prep, GC/chlamydia, CBC, ABO/Rh, quant hCG, and [place] today to rule out ectopic pregnancy which can be life threatening.   Ultrasound shows live IUP measuring [redacted]w[redacted]d and small SCHs.   Will rx phenergan for nausea  ASSESSMENT 1. Abdominal pain during pregnancy in first trimester   2. Normal IUP (intrauterine pregnancy) on  prenatal ultrasound, first trimester   3. Pregnancy related nausea, antepartum     PLAN Discharge home in stable condition. Miscarriage precautions Start prenatal care - given list of providers Rx phenergan prn   Allergies as of 04/08/2020   No Known Allergies     Medication List    STOP taking these medications   ondansetron 4 MG tablet Commonly known as: ZOFRAN     TAKE these medications   acetaminophen 500 MG tablet Commonly known as: TYLENOL Take 1 tablet (500 mg total) by mouth every 6 (six) hours as needed.   famotidine 20 MG tablet Commonly known as: PEPCID Take 1 tablet (20 mg total) by mouth 2 (two) times daily.   promethazine 25 MG tablet Commonly known as: PHENERGAN Take 1 tablet (25 mg total) by mouth every 6 (six) hours as needed for nausea or vomiting.        Judeth Horn, NP 04/08/2020  3:14 PM

## 2020-04-08 NOTE — MAU Note (Signed)
Sent up from ED 

## 2020-04-08 NOTE — ED Provider Notes (Signed)
MSE was initiated and I personally evaluated the patient and placed orders (if any) at  11:31 AM on April 08, 2020.  Patient is a 29 year old G2P1 who is estimated to be about [redacted] weeks pregnant.  Pregnancy was [facility], 10 days ago.  Patient states that about 3 weeks ago she began experiencing intermittent suprapubic cramping.  This occurs about twice a week.  No modifying factors.  This morning she began experiencing an episode of suprapubic cramping that she describes as "the worst yet". It is 8/10. Constant.  Her first child is 69 years old.  She was full-term and born via cesarean section.   Patient denies any other symptoms at this time.  No vaginal discharge, bleeding, dysuria, hematuria, vomiting, diarrhea.  I spoke to the MAU regarding this patient.  She will be transferred there at this time.  The patient appears stable so that the remainder of the MSE may be completed by another provider.  Note: Portions of this report may have been transcribed using voice recognition software. Every effort was made to ensure accuracy; however, inadvertent computerized transcription errors may be present.     Placido Sou, PA-C 04/08/20 1154    Alvira Monday, MD 04/09/20 0004

## 2020-04-08 NOTE — MAU Note (Signed)
PT sent from [facility] Ed. C/O lower abdominal pain/cramping for several weeks. Got more intense today.  Rating pain 8/10. Denies VD or bleeding.

## 2020-04-09 LAB — GC/CHLAMYDIA PROBE AMP (~~LOC~~) NOT AT ARMC
Chlamydia: NEGATIVE
Comment: NEGATIVE
Comment: NORMAL
Neisseria Gonorrhea: NEGATIVE

## 2020-05-28 ENCOUNTER — Emergency Department (HOSPITAL_BASED_OUTPATIENT_CLINIC_OR_DEPARTMENT_OTHER)
Admission: EM | Admit: 2020-05-28 | Discharge: 2020-05-28 | Disposition: A | Discharge status: Home / Self Care | Payer: Medicaid Other | Attending: Emergency Medicine | Admitting: Emergency Medicine

## 2020-05-28 ENCOUNTER — Encounter (HOSPITAL_BASED_OUTPATIENT_CLINIC_OR_DEPARTMENT_OTHER): Payer: Self-pay | Admitting: Emergency Medicine

## 2020-05-28 ENCOUNTER — Other Ambulatory Visit: Payer: Self-pay

## 2020-05-28 DIAGNOSIS — K469 Unspecified abdominal hernia without obstruction or gangrene: Secondary | ICD-10-CM | POA: Diagnosis not present

## 2020-05-28 DIAGNOSIS — R109 Unspecified abdominal pain: Secondary | ICD-10-CM | POA: Diagnosis present

## 2020-05-28 DIAGNOSIS — Z79899 Other long term (current) drug therapy: Secondary | ICD-10-CM | POA: Insufficient documentation

## 2020-05-28 NOTE — Discharge Instructions (Signed)
If this area comes out and is irritating you then lie back flat and gently apply downward pressure until it goes back inside your abdominal cavity.  If you are unable to get it back in or the pain worsens or you start to vomit then please return to the ED.

## 2020-05-28 NOTE — ED Provider Notes (Signed)
[facility] EMERGENCY DEPARTMENT Provider Note   CSN: [id number] Arrival date & time: 05/28/20  0740     History Chief Complaint  Patient presents with  . Abdominal Pain    RLQ    Tricia Walter is a 29 y.o. female.  29 yo F with a chief complaints of a bulge to her right lower abdomen.  She noticed this this morning.  When she lays back flat it becomes more prominent.  No significant tenderness to it no nausea no vomiting.  She has had a prior C-section denies other abdominal surgery.  Denies fevers.  The history is provided by the patient.  Abdominal Pain Pain location:  RLQ Pain quality: aching   Pain radiates to:  Does not radiate Pain severity:  Mild Onset quality:  Gradual Duration:  2 hours Timing:  Constant Progression:  Unchanged Chronicity:  New Relieved by:  Nothing Worsened by:  Nothing Ineffective treatments:  None tried Associated symptoms: no chest pain, no chills, no dysuria, no fever, no nausea, no shortness of breath and no vomiting        Past Medical History:  Diagnosis Date  . Migraine     There are no problems to display for this patient.   Past Surgical History:  Procedure Laterality Date  . CESAREAN SECTION       OB History    Gravida  2   Para  1   Term  1   Preterm      AB      Living  1     SAB      TAB      Ectopic      Multiple      Live Births  1           No family history on file.  Social History   Tobacco Use  . Smoking status: Never Smoker  . Smokeless tobacco: Never Used  Substance Use Topics  . Alcohol use: Yes    Comment: occ  . Drug use: No    Home Medications Prior to Admission medications   Medication Sig Start Date End Date Taking? Authorizing Provider  acetaminophen (TYLENOL) 500 MG tablet Take 1 tablet (500 mg total) by mouth every 6 (six) hours as needed. 07/03/19   Fawze, Mina A, PA-C  famotidine (PEPCID) 20 MG tablet Take 1 tablet (20 mg total) by mouth 2 (two)  times daily. 07/03/19   Fawze, Mina A, PA-C  promethazine (PHENERGAN) 25 MG tablet Take 1 tablet (25 mg total) by mouth every 6 (six) hours as needed for nausea or vomiting. 04/08/20   Judeth Horn, NP    Allergies    Patient has no known allergies.  Review of Systems   Review of Systems  Constitutional: Negative for chills and fever.  HENT: Negative for congestion and rhinorrhea.   Eyes: Negative for redness and visual disturbance.  Respiratory: Negative for shortness of breath and wheezing.   Cardiovascular: Negative for chest pain and palpitations.  Gastrointestinal: Positive for abdominal pain. Negative for nausea and vomiting.  Genitourinary: Negative for dysuria and urgency.  Musculoskeletal: Negative for arthralgias and myalgias.  Skin: Negative for pallor and wound.  Neurological: Negative for dizziness and headaches.    Physical Exam Updated Vital Signs BP 111/74 (BP Location: Right Arm)   Pulse 77   Temp 97.9 F (36.6 C) (Oral)   Resp 16   Ht 5\' 3"  (1.6 m)   Wt 70.3 kg  LMP 05/19/2020   SpO2 100%   Breastfeeding Unknown   BMI 27.46 kg/m   Physical Exam Vitals and nursing note reviewed.  Constitutional:      General: She is not in acute distress.    Appearance: She is well-developed. She is not diaphoretic.  HENT:     Head: Normocephalic and atraumatic.  Eyes:     Pupils: Pupils are equal, round, and reactive to light.  Cardiovascular:     Rate and Rhythm: Normal rate and regular rhythm.     Heart sounds: No murmur heard.  No friction rub. No gallop.   Pulmonary:     Effort: Pulmonary effort is normal.     Breath sounds: No wheezing or rales.  Abdominal:     General: There is no distension.     Palpations: Abdomen is soft.     Tenderness: There is no abdominal tenderness.     Comments: A bulge is noted right of the midline inferior and lateral to the umbilicus.  Adjacent to a central midline C-section scar.  No obvious palpable defect.    Musculoskeletal:        General: No tenderness.     Cervical back: Normal range of motion and neck supple.  Skin:    General: Skin is warm and dry.  Neurological:     Mental Status: She is alert and oriented to person, place, and time.  Psychiatric:        Behavior: Behavior normal.     ED Results / Procedures / Treatments   Labs (all labs ordered are listed, but only abnormal results are displayed) Labs Reviewed - No data to display  EKG None  Radiology No results found.  Procedures Hernia reduction  Date/Time: 05/28/2020 8:17 AM Performed by: Melene Plan, DO Authorized by: Melene Plan, DO  Preparation: Patient was prepped and draped in the usual sterile fashion. Local anesthesia used: no  Anesthesia: Local anesthesia used: no  Sedation: Patient sedated: no  Patient tolerance: patient tolerated the procedure well with no immediate complications    (including critical care time)  Medications Ordered in ED Medications - No data to display  ED Course  I have reviewed the triage vital signs and the nursing notes.  Pertinent labs & imaging results that were available during my care of the patient were reviewed by me and considered in my medical decision making (see chart for details).    MDM Rules/Calculators/A&P                          29 yo F with a chief complaints of the right lower abdominal bulge.  Most likely this is a abdominal hernia.  It was reduced at bedside without issue.  No obvious palpable defect.  Question if it is due to her C-section incision or if this is a spigelian hernia.  Given general surgery follow-up.   8:19 AM:  I have discussed the diagnosis/risks/treatment options with the patient and believe the pt to be eligible for discharge home to follow-up with Gen surgery, PCP. We also discussed returning to the ED immediately if new or worsening sx occur. We discussed the sx which are most concerning (e.g., sudden worsening pain, fever,  inability to tolerate by mouth, if it is not reduced at home, or if she develops vomiting) that necessitate immediate return. Medications administered to the patient during their visit and any new prescriptions provided to the patient are listed below.  Medications  given during this visit Medications - No data to display   The patient appears reasonably screen and/or stabilized for discharge and I doubt any other medical condition or other [facility] requiring further screening, evaluation, or treatment in the ED at this time prior to discharge.   Final Clinical Impression(s) / ED Diagnoses Final diagnoses:  Hernia of abdominal cavity    Rx / DC Orders ED Discharge Orders    None       Melene Plan, DO 05/28/20 [phone]

## 2020-05-28 NOTE — ED Triage Notes (Signed)
RLQ pain started this Am ," feels a knot". Had a miscarriage last month. Some nausea no emesis.

## 2020-06-10 ENCOUNTER — Encounter (HOSPITAL_BASED_OUTPATIENT_CLINIC_OR_DEPARTMENT_OTHER): Payer: Self-pay | Admitting: Emergency Medicine

## 2020-06-10 ENCOUNTER — Emergency Department (HOSPITAL_BASED_OUTPATIENT_CLINIC_OR_DEPARTMENT_OTHER): Payer: Medicaid Other

## 2020-06-10 ENCOUNTER — Emergency Department (HOSPITAL_BASED_OUTPATIENT_CLINIC_OR_DEPARTMENT_OTHER)
Admission: EM | Admit: 2020-06-10 | Discharge: 2020-06-10 | Disposition: A | Discharge status: Home / Self Care | Payer: Medicaid Other | Attending: Emergency Medicine | Admitting: Emergency Medicine

## 2020-06-10 ENCOUNTER — Other Ambulatory Visit: Payer: Self-pay

## 2020-06-10 DIAGNOSIS — O4692 Antepartum hemorrhage, unspecified, second trimester: Secondary | ICD-10-CM | POA: Insufficient documentation

## 2020-06-10 DIAGNOSIS — Z3A16 16 weeks gestation of pregnancy: Secondary | ICD-10-CM | POA: Insufficient documentation

## 2020-06-10 DIAGNOSIS — O418X20 Other specified disorders of amniotic fluid and membranes, second trimester, not applicable or unspecified: Secondary | ICD-10-CM

## 2020-06-10 DIAGNOSIS — O26892 Other specified pregnancy related conditions, second trimester: Secondary | ICD-10-CM | POA: Diagnosis present

## 2020-06-10 DIAGNOSIS — Z79899 Other long term (current) drug therapy: Secondary | ICD-10-CM | POA: Insufficient documentation

## 2020-06-10 DIAGNOSIS — O468X2 Other antepartum hemorrhage, second trimester: Secondary | ICD-10-CM

## 2020-06-10 DIAGNOSIS — N939 Abnormal uterine and vaginal bleeding, unspecified: Secondary | ICD-10-CM

## 2020-06-10 LAB — URINALYSIS, ROUTINE W REFLEX MICROSCOPIC
Bilirubin Urine: NEGATIVE
Glucose, UA: NEGATIVE mg/dL
Ketones, ur: NEGATIVE mg/dL
Leukocytes,Ua: NEGATIVE
Nitrite: POSITIVE — AB
Protein, ur: NEGATIVE mg/dL
Specific Gravity, Urine: >1.03 — ABNORMAL HIGH (ref 1.005–1.030)
pH: 6 (ref 5.0–8.0)

## 2020-06-10 LAB — COMPREHENSIVE METABOLIC PANEL WITH GFR
ALT: 16 U/L (ref 0–44)
AST: 17 U/L (ref 15–41)
Albumin: 3.3 g/dL — ABNORMAL LOW (ref 3.5–5.0)
Alkaline Phosphatase: 56 U/L (ref 38–126)
Anion gap: 12 (ref 5–15)
BUN: 7 mg/dL (ref 6–20)
CO2: 21 mmol/L — ABNORMAL LOW (ref 22–32)
Calcium: 8.5 mg/dL — ABNORMAL LOW (ref 8.9–10.3)
Chloride: 101 mmol/L (ref 98–111)
Creatinine, Ser: 0.71 mg/dL (ref 0.44–1.00)
GFR calc Af Amer: >60 mL/min (ref >60)
GFR calc non Af Amer: >60 mL/min (ref >60)
Glucose, Bld: 80 mg/dL (ref 70–99)
Potassium: 3.6 mmol/L (ref 3.5–5.1)
Sodium: 134 mmol/L — ABNORMAL LOW (ref 135–145)
Total Bilirubin: 0.5 mg/dL (ref 0.3–1.2)
Total Protein: 7 g/dL (ref 6.5–8.1)

## 2020-06-10 LAB — CBC WITH DIFFERENTIAL/PLATELET
Abs Immature Granulocytes: 0.04 10*3/uL (ref 0.00–0.07)
Basophils Absolute: 0 10*3/uL (ref 0.0–0.1)
Basophils Relative: 0 %
Eosinophils Absolute: 0 10*3/uL (ref 0.0–0.5)
Eosinophils Relative: 0 %
HCT: 33.6 % — ABNORMAL LOW (ref 36.0–46.0)
Hemoglobin: 11.7 g/dL — ABNORMAL LOW (ref 12.0–15.0)
Immature Granulocytes: 1 %
Lymphocytes Relative: 29 %
Lymphs Abs: 2.4 10*3/uL (ref 0.7–4.0)
MCH: 32.4 pg (ref 26.0–34.0)
MCHC: 34.8 g/dL (ref 30.0–36.0)
MCV: 93.1 fL (ref 80.0–100.0)
Monocytes Absolute: 0.6 10*3/uL (ref 0.1–1.0)
Monocytes Relative: 7 %
Neutro Abs: 5.2 10*3/uL (ref 1.7–7.7)
Neutrophils Relative %: 63 %
Platelets: 216 10*3/uL (ref 150–400)
RBC: 3.61 MIL/uL — ABNORMAL LOW (ref 3.87–5.11)
RDW: 12.9 % (ref 11.5–15.5)
WBC: 8.2 10*3/uL (ref 4.0–10.5)
nRBC: 0 % (ref 0.0–0.2)

## 2020-06-10 LAB — WET PREP, GENITAL
Sperm: NONE SEEN
Trich, Wet Prep: NONE SEEN
Yeast Wet Prep HPF POC: NONE SEEN

## 2020-06-10 LAB — HCG, QUANTITATIVE, PREGNANCY: hCG, Beta Chain, Quant, S: 46272 m[IU]/mL — ABNORMAL HIGH (ref <5)

## 2020-06-10 LAB — URINALYSIS, MICROSCOPIC (REFLEX)

## 2020-06-10 MED ORDER — PRENATAL COMPLETE 14-0.4 MG PO TABS: 1.0000 | ORAL_TABLET | Freq: Every day | ORAL | 0 refills | Status: DC

## 2020-06-10 NOTE — ED Notes (Signed)
Presents with a 2 month hx of abd pain, has had some nausea with pain, denies any vomiting.

## 2020-06-10 NOTE — ED Triage Notes (Signed)
On and off sharp abdominal pain, worse at night. sts existent mass

## 2020-06-10 NOTE — ED Notes (Signed)
AVS reviewed with client as outlined by EDP, stressed the importance of making and keeping OB appt and following guidelines for Second Trimester Pregnancy. Copy of AVS provided, opportunity for questions provided

## 2020-06-10 NOTE — ED Provider Notes (Signed)
[facility] EMERGENCY DEPARTMENT Provider Note   CSN: [id number] Arrival date & time: 06/10/20  1136     History Chief Complaint  Patient presents with  . Abdominal Pain    Tricia Walter is a 29 y.o. female.  Patient is a 29 year old female with no significant past medical history presenting today with a complaint of lower abdominal pain.  Patient reports that she had a spontaneous miscarriage approximately 1-1/2 months ago.  She was seen in the emergency room had ultrasound showing subchorionic hemorrhage and reports shortly after that she had significant bleeding.  She never followed up with OB/GYN and reports her menses stopped.  She had a normal menses at the beginning of this month but then reports in the last week she can just continues to have heavy bleeding and clots.  She was seen at the emergency room on 05/28/2020 and at that time was found to have what was thought to be a right lower quadrant hernia that was reduced.  She reports she did follow-up with general surgery but they requested doing CAT scans or MRIs to have more evaluation.  She reports since being seen in the emergency room she has had ongoing pain in her lower abdomen.  She reports that she usually had a 3 out of 10 but then intermittently can get sharp severe pain.  It does usually seem to be worse with movement.  She has had no fever or vomiting.  Bowel movements have not been significantly different but does have some mild dysuria.  The history is provided by the patient.  Abdominal Pain Pain location:  Suprapubic Pain quality: aching and cramping   Pain radiates to:  Does not radiate Pain severity:  Moderate Onset quality:  Gradual Duration:  4 weeks Timing:  Intermittent Progression:  Waxing and waning Chronicity:  New      Past Medical History:  Diagnosis Date  . Migraine     There are no problems to display for this patient.   Past Surgical History:  Procedure Laterality Date  .  CESAREAN SECTION       OB History    Gravida  2   Para  1   Term  1   Preterm      AB      Living  1     SAB      TAB      Ectopic      Multiple      Live Births  1           No family history on file.  Social History   Tobacco Use  . Smoking status: Never Smoker  . Smokeless tobacco: Never Used  Substance Use Topics  . Alcohol use: Yes    Comment: occ  . Drug use: No    Home Medications Prior to Admission medications   Medication Sig Start Date End Date Taking? Authorizing Provider  acetaminophen (TYLENOL) 500 MG tablet Take 1 tablet (500 mg total) by mouth every 6 (six) hours as needed. 07/03/19   Fawze, Mina A, PA-C  famotidine (PEPCID) 20 MG tablet Take 1 tablet (20 mg total) by mouth 2 (two) times daily. 07/03/19   Fawze, Mina A, PA-C  promethazine (PHENERGAN) 25 MG tablet Take 1 tablet (25 mg total) by mouth every 6 (six) hours as needed for nausea or vomiting. 04/08/20   Judeth Horn, NP    Allergies    Patient has no known allergies.  Review of Systems  Review of Systems  Gastrointestinal: Positive for abdominal pain.  All other systems reviewed and are negative.   Physical Exam Updated Vital Signs BP 115/75   Pulse 82   Temp 98.1 F (36.7 C) (Oral)   Resp 18   LMP 05/19/2020   SpO2 99%   Physical Exam Vitals and nursing note reviewed.  Constitutional:      General: She is not in acute distress.    Appearance: She is well-developed and normal weight.  HENT:     Head: Normocephalic and atraumatic.  Eyes:     Pupils: Pupils are equal, round, and reactive to light.  Cardiovascular:     Rate and Rhythm: Normal rate and regular rhythm.     Heart sounds: Normal heart sounds. No murmur heard.  No friction rub.  Pulmonary:     Effort: Pulmonary effort is normal.     Breath sounds: Normal breath sounds. No wheezing or rales.  Abdominal:     General: Abdomen is flat. Bowel sounds are normal. There is no distension.      Palpations: Abdomen is soft.     Tenderness: There is abdominal tenderness in the right lower quadrant, suprapubic area and left lower quadrant. There is no right CVA tenderness, left CVA tenderness, guarding or rebound.     Hernia: No hernia is present.  Genitourinary:    Vagina: Bleeding present.     Uterus: Enlarged.      Adnexa: Right adnexa normal and left adnexa normal.     Comments: Mild tenderness throughout but no chandelier signs.  Blood pooling in the vaginal vault.  Os is closed Musculoskeletal:        General: No tenderness. Normal range of motion.     Comments: No edema  Skin:    General: Skin is warm and dry.     Capillary Refill: Capillary refill takes less than 2 seconds.     Findings: No rash.  Neurological:     General: No focal deficit present.     Mental Status: She is alert and oriented to person, place, and time.     Cranial Nerves: No cranial nerve deficit.  Psychiatric:        Mood and Affect: Mood normal.        Behavior: Behavior normal.     ED Results / Procedures / Treatments   Labs (all labs ordered are listed, but only abnormal results are displayed) Labs Reviewed  WET PREP, GENITAL - Abnormal; Notable for the following components:      Result Value   Clue Cells Wet Prep HPF POC PRESENT (*)    WBC, Wet Prep HPF POC MANY (*)    All other components within normal limits  CBC WITH DIFFERENTIAL/PLATELET - Abnormal; Notable for the following components:   RBC 3.61 (*)    Hemoglobin 11.7 (*)    HCT 33.6 (*)    All other components within normal limits  COMPREHENSIVE METABOLIC PANEL - Abnormal; Notable for the following components:   Sodium 134 (*)    CO2 21 (*)    Calcium 8.5 (*)    Albumin 3.3 (*)    All other components within normal limits  URINALYSIS, ROUTINE W REFLEX MICROSCOPIC - Abnormal; Notable for the following components:   APPearance CLOUDY (*)    Specific Gravity, Urine >1.030 (*)    Hgb urine dipstick MODERATE (*)    Nitrite  POSITIVE (*)    All other components within normal limits  HCG, QUANTITATIVE,  PREGNANCY - Abnormal; Notable for the following components:   hCG, Beta Chain, Quant, S [id number] (*)    All other components within normal limits  URINALYSIS, MICROSCOPIC (REFLEX) - Abnormal; Notable for the following components:   Bacteria, UA MANY (*)    All other components within normal limits  GC/CHLAMYDIA PROBE AMP ([place]) NOT AT [facility]    EKG None  Radiology No results found.  Procedures Procedures (including critical care time)  Medications Ordered in ED Medications - No data to display  ED Course  I have reviewed the triage vital signs and the nursing notes.  Pertinent labs & imaging results that were available during my care of the patient were reviewed by me and considered in my medical decision making (see chart for details).    MDM Rules/Calculators/A&P                          Patient is a 29 year old female presenting today with persistent lower abdominal pain.  Question of right lower quadrant hernia 13 days ago that was reduced in the emergency room however no imaging during that time.  Patient has had ongoing pain and did follow-up with a general surgeon but they wanted imaging before any further plans were made.  Because patient was having ongoing pain they recommended she return to the emergency room because it would take time to get the imaging.  Patient is also having heavy vaginal bleeding and recently had miscarriage.  Will do pelvic exam to ensure no concern for infectious etiology.  Labs and hCG are pending as she never had a repeat hCG after she miscarried.  We will then proceed with CAT scan.  2:30 PM Pelvic with diffuse pain and bleeding but not severe pain. Will follow up on hcg and if not 0 will get u/s to further eval for retained products.  If neg will get CT.  2:42 PM Wet prep with clue cells and many WBC's.  Hemoglobin is stable at 11.7 and white count is within  normal limits, CMP with normal renal function, UA with moderate blood and positive nitrates with some contamination and many bacteria.  hCG today remains elevated at 46,000.  hCG on 04/08/2020 was 164,000.  Ultrasound at the time showed 2 small subchorionic hemorrhages but IUP at [redacted] weeks gestation.  Will repeat ultrasound at this time as hCG is decreasing but patient has now been bleeding for several weeks which she reports is heavier than menses where she is going through multiple tampons and pads per day.  Final Clinical Impression(s) / ED Diagnoses Final diagnoses:  None    Rx / DC Orders ED Discharge Orders    None       Gwyneth Sprout, MD 06/10/20 1444

## 2020-06-10 NOTE — ED Notes (Signed)
Pelvic Cart to room, pt instructed to undress and place gown on, blanket provided

## 2020-06-10 NOTE — ED Provider Notes (Signed)
Persistent low abdo pain. Seen by surgery outpatient. HCG 46,000. Pelvic US. Physical Exam  BP 115/75   Pulse 82   Temp 98.1 F (36.7 C) (Oral)   Resp 18   LMP 05/19/2020   SpO2 99%   Physical Exam  ED Course/Procedures     Procedures  MDM   Ultrasound confirms second trimester pregnancy at 16 weeks 6 days, marginal previa and small subchorionic hemorrhage.  This was reviewed with Dr. Leroy Libman at [facility].  She advises the patient can follow-up on outpatient basis.  She requested I would message her and patient will be contacted tomorrow to get set up at a clinic near the patient.  She is alert and well in appearance.  She is not having any pain at this time.  She has been having sporadic bleeding that has been heavy over the past week.  However it will signs are stable and patient is not anemic.  At this time stable for discharge and close follow-up.  She has been counseled on starting prenatal vitamins, no alcohol or tobacco, go to MAU for any worsening bleeding or pain.      Arby Barrette, MD 06/10/20 [phone]

## 2020-06-10 NOTE — Discharge Instructions (Signed)
1.  You will be called tomorrow by [facility]  They will help you establish a follow-up appointment at a facility near you. 2.  Start prenatal vitamins.  Do not smoke or drink alcohol.  Been given additional information for second trimester pregnancy. 3.  You have a condition called a subchorionic hemorrhage.  This can occur during pregnancy when the placenta separates a bit from the uterine wall and bleeds.  You may still go on to have a normal pregnancy.  This however will need to be monitored by your obstetrician.  You continue to bleed heavily and feel lightheaded or increasing pain, return to the emergency department.  You should go to the MAU at [facility] for evaluation if symptoms are worsening.

## 2020-06-11 LAB — GC/CHLAMYDIA PROBE AMP (~~LOC~~) NOT AT ARMC
Chlamydia: NEGATIVE
Comment: NEGATIVE
Comment: NORMAL
Neisseria Gonorrhea: NEGATIVE

## 2020-06-17 ENCOUNTER — Encounter: Payer: Self-pay | Admitting: Obstetrics & Gynecology

## 2020-06-17 ENCOUNTER — Other Ambulatory Visit: Payer: Self-pay

## 2020-06-17 ENCOUNTER — Ambulatory Visit (INDEPENDENT_AMBULATORY_CARE_PROVIDER_SITE_OTHER): Payer: Medicaid Other | Admitting: Obstetrics & Gynecology

## 2020-06-17 VITALS — BP 113/79 | HR 82 | Wt 165.0 lb

## 2020-06-17 DIAGNOSIS — Z98891 History of uterine scar from previous surgery: Secondary | ICD-10-CM

## 2020-06-17 DIAGNOSIS — O418X20 Other specified disorders of amniotic fluid and membranes, second trimester, not applicable or unspecified: Secondary | ICD-10-CM

## 2020-06-17 DIAGNOSIS — Z3A17 17 weeks gestation of pregnancy: Secondary | ICD-10-CM

## 2020-06-17 DIAGNOSIS — Z3482 Encounter for supervision of other normal pregnancy, second trimester: Secondary | ICD-10-CM

## 2020-06-17 DIAGNOSIS — Z3480 Encounter for supervision of other normal pregnancy, unspecified trimester: Secondary | ICD-10-CM

## 2020-06-17 DIAGNOSIS — O468X2 Other antepartum hemorrhage, second trimester: Secondary | ICD-10-CM | POA: Diagnosis not present

## 2020-06-17 DIAGNOSIS — O0990 Supervision of high risk pregnancy, unspecified, unspecified trimester: Secondary | ICD-10-CM | POA: Insufficient documentation

## 2020-06-17 MED ORDER — BLOOD PRESSURE KIT DEVI: 1.0000 | 0 refills | Status: DC | PRN

## 2020-06-17 NOTE — Patient Instructions (Signed)

## 2020-06-17 NOTE — Progress Notes (Signed)
  Subjective:has vaginal bleeding    Tricia Walter is a G3P1011 [redacted]w[redacted]d being seen today for her first obstetrical visit.  Her obstetrical history is significant for previous cesarean, has vaginal bleeding. Patient does intend to breast feed. Pregnancy history fully reviewed.  Patient reports bleeding.  Vitals:   06/17/20 1006  BP: 113/79  Pulse: 82  Weight: 165 lb (74.8 kg)    HISTORY: OB History  Gravida Para Term Preterm AB Living  3 1 1   1 1   SAB TAB Ectopic Multiple Live Births    1     1    # Outcome Date GA Lbr Len/2nd Weight Sex Delivery Anes PTL Lv  3 Current           2 TAB 03/2019 [redacted]w[redacted]d         1 Term 07/30/10 [redacted]w[redacted]d  8 lb 9 oz (3.884 kg) F CS-LTranv   LIV     Complications: Elective surgery   Past Medical History:  Diagnosis Date  . Migraine    Past Surgical History:  Procedure Laterality Date  . CESAREAN SECTION     Family History  Problem Relation Age of Onset  . Colon cancer Mother   . COPD Mother   . Hypertension Mother   . Lung cancer Father      Exam    Uterus:     Pelvic Exam:    Perineum: No Hemorrhoids   Vulva: normal   Vagina:  scant blood   pH:     Cervix: no lesions   Adnexa: normal adnexa   Bony Pelvis: average  System: Breast:  normal appearance, no masses or tenderness   Skin: normal coloration and turgor, no rashes    Neurologic: oriented, normal mood   Extremities: normal strength, tone, and muscle mass   HEENT PERRLA   Mouth/Teeth mucous membranes moist, pharynx normal without lesions   Neck supple   Cardiovascular: regular rate and rhythm   Respiratory:  appears well, vitals normal, no respiratory distress, acyanotic, normal RR, neck free of mass or lymphadenopathy   Abdomen: gravid   Urinary: urethral meatus normal      Assessment:    Pregnancy: [redacted]w[redacted]d Patient Active Problem List   Diagnosis Date Noted  . Supervision of other normal pregnancy, antepartum 06/17/2020        Plan:     Initial labs  drawn. Prenatal vitamins. Problem list reviewed and updated. Genetic Screening discussed ordered.  Follow up in 4 weeks. 50% of 30 min visit spent on counseling and coordination of care.  Record from [place] needed F/u anatomy scan 1-2 weeks   06/19/2020 06/17/2020

## 2020-06-17 NOTE — Progress Notes (Signed)
NOB in office, late to care.Pt reports fetal movement. Pt reports cramping and small to moderate bleeding that varies in color . Pt reports [facility]

## 2020-06-18 ENCOUNTER — Inpatient Hospital Stay (HOSPITAL_COMMUNITY)
Admission: AD | Admit: 2020-06-18 | Discharge: 2020-06-19 | DRG: 833 | Disposition: A | Discharge status: Home / Self Care | Payer: Medicaid Other | Attending: Obstetrics and Gynecology | Admitting: Obstetrics and Gynecology

## 2020-06-18 ENCOUNTER — Inpatient Hospital Stay (HOSPITAL_BASED_OUTPATIENT_CLINIC_OR_DEPARTMENT_OTHER): Payer: Medicaid Other

## 2020-06-18 ENCOUNTER — Other Ambulatory Visit: Payer: Self-pay

## 2020-06-18 ENCOUNTER — Inpatient Hospital Stay (HOSPITAL_COMMUNITY): Payer: Medicaid Other

## 2020-06-18 ENCOUNTER — Encounter (HOSPITAL_COMMUNITY): Payer: Self-pay | Admitting: Obstetrics and Gynecology

## 2020-06-18 DIAGNOSIS — O34219 Maternal care for unspecified type scar from previous cesarean delivery: Secondary | ICD-10-CM | POA: Diagnosis present

## 2020-06-18 DIAGNOSIS — O4412 Placenta previa with hemorrhage, second trimester: Principal | ICD-10-CM | POA: Diagnosis present

## 2020-06-18 DIAGNOSIS — O468X2 Other antepartum hemorrhage, second trimester: Secondary | ICD-10-CM | POA: Diagnosis present

## 2020-06-18 DIAGNOSIS — O99012 Anemia complicating pregnancy, second trimester: Secondary | ICD-10-CM | POA: Diagnosis present

## 2020-06-18 DIAGNOSIS — Z3A17 17 weeks gestation of pregnancy: Secondary | ICD-10-CM

## 2020-06-18 DIAGNOSIS — O4400 Placenta previa specified as without hemorrhage, unspecified trimester: Secondary | ICD-10-CM | POA: Diagnosis present

## 2020-06-18 DIAGNOSIS — O4692 Antepartum hemorrhage, unspecified, second trimester: Secondary | ICD-10-CM

## 2020-06-18 DIAGNOSIS — O458X2 Other premature separation of placenta, second trimester: Secondary | ICD-10-CM | POA: Diagnosis not present

## 2020-06-18 DIAGNOSIS — O99019 Anemia complicating pregnancy, unspecified trimester: Secondary | ICD-10-CM | POA: Diagnosis present

## 2020-06-18 DIAGNOSIS — D649 Anemia, unspecified: Secondary | ICD-10-CM | POA: Diagnosis present

## 2020-06-18 DIAGNOSIS — Z20822 Contact with and (suspected) exposure to covid-19: Secondary | ICD-10-CM | POA: Diagnosis present

## 2020-06-18 DIAGNOSIS — O4402 Placenta previa specified as without hemorrhage, second trimester: Secondary | ICD-10-CM | POA: Diagnosis not present

## 2020-06-18 DIAGNOSIS — Z98891 History of uterine scar from previous surgery: Secondary | ICD-10-CM

## 2020-06-18 DIAGNOSIS — O418X20 Other specified disorders of amniotic fluid and membranes, second trimester, not applicable or unspecified: Secondary | ICD-10-CM | POA: Diagnosis present

## 2020-06-18 DIAGNOSIS — O209 Hemorrhage in early pregnancy, unspecified: Secondary | ICD-10-CM | POA: Diagnosis present

## 2020-06-18 HISTORY — DX: Unspecified infectious disease: B99.9

## 2020-06-18 HISTORY — DX: Anemia, unspecified: D64.9

## 2020-06-18 LAB — RESP PANEL BY RT PCR (RSV, FLU A&B, COVID)
Influenza A by PCR: NEGATIVE
Influenza B by PCR: NEGATIVE
Respiratory Syncytial Virus by PCR: NEGATIVE
SARS Coronavirus 2 by RT PCR: NEGATIVE

## 2020-06-18 LAB — CBC
HCT: 29.6 % — ABNORMAL LOW (ref 36.0–46.0)
Hemoglobin: 10 g/dL — ABNORMAL LOW (ref 12.0–15.0)
MCH: 31.8 pg (ref 26.0–34.0)
MCHC: 33.8 g/dL (ref 30.0–36.0)
MCV: 94.3 fL (ref 80.0–100.0)
Platelets: 202 10*3/uL (ref 150–400)
RBC: 3.14 MIL/uL — ABNORMAL LOW (ref 3.87–5.11)
RDW: 13 % (ref 11.5–15.5)
WBC: 8.1 10*3/uL (ref 4.0–10.5)
nRBC: 0 % (ref 0.0–0.2)

## 2020-06-18 MED ORDER — DOCUSATE SODIUM 100 MG PO CAPS: 100.0000 mg | ORAL_CAPSULE | Freq: Two times a day (BID) | ORAL | Status: DC | PRN

## 2020-06-18 MED ORDER — ACETAMINOPHEN 325 MG PO TABS
650.0000 mg | ORAL_TABLET | ORAL | Status: DC | PRN
  Administered 2020-06-19: 650 mg via ORAL
  Filled 2020-06-18: qty 2

## 2020-06-18 MED ORDER — CALCIUM CARBONATE ANTACID 500 MG PO CHEW
2.0000 | CHEWABLE_TABLET | ORAL | Status: DC | PRN
  Administered 2020-06-18: 400 mg via ORAL
  Filled 2020-06-18: qty 2

## 2020-06-18 MED ORDER — PRENATAL MULTIVITAMIN CH
1.0000 | ORAL_TABLET | Freq: Every day | ORAL | Status: DC
  Administered 2020-06-19: 1 via ORAL
  Filled 2020-06-18: qty 1

## 2020-06-18 MED ORDER — ZOLPIDEM TARTRATE 5 MG PO TABS: 5.0000 mg | ORAL_TABLET | Freq: Every evening | ORAL | Status: DC | PRN

## 2020-06-18 NOTE — MAU Note (Addendum)
Been bleeding her whole pregnancy, been severe the past days, been through 6 ulta heavy tampons/pads. Cramping. Known subchorionic hemorrhage from early [place].  Concerned with amt of bleeding she is having.  Was told her OB when explain the last [place].

## 2020-06-18 NOTE — H&P (Addendum)
ANTEPARTUM ADMISSION HISTORY AND PHYSICAL NOTE   History of Present Illness: Tricia Walter is a 29 y.o. G3P1011 at 44w2dadmitted for vaginal bleeding in setting of an anterior placenta previa and subjectively low fluid with questionable SROM.  Patient reports the fetal movement as only when she lies back.  Patient reports uterine contraction  activity as none but endorses ongoing mid lumbar pain.   Patient reports  vaginal bleeding as heavier than a period, passing clots. The bleeding got worse from her visit on 06/10/2020. . See MAU note for bleeding history.  Patient describes fluid per vagina as None. Fetal presentation is unsure.  Limited [place] shows cervix at 4.36 cm, subjectively low fluid and anterior previa with moderate subchorionic hemorrhage at the os.   Patient Active Problem List   Diagnosis Date Noted  . History of cesarean delivery 06/18/2020  . Placenta previa 06/18/2020  . Subchorionic hematoma in second trimester 06/18/2020  . Supervision of other normal pregnancy, antepartum 06/17/2020    Past Medical History:  Diagnosis Date  . Anemia   . Infection    UTI  . Migraine     Past Surgical History:  Procedure Laterality Date  . CESAREAN SECTION      OB History  Gravida Para Term Preterm AB Living  _0 SAB TAB Ectopic Multiple Live Births    1     1    # Outcome Date GA Lbr Len/2nd Weight Sex Delivery Anes PTL Lv  3 Current           2 TAB 03/2019 667w0d       1 Term 07/30/10 [id number] g F CS-LTranv   LIV     Complications: Elective surgery    Social History   Socioeconomic History  . Marital status: Single    Spouse name: Not on file  . Number of children: Not on file  . Years of education: Not on file  . Highest education level: Not on file  Occupational History  . Not on file  Tobacco Use  . Smoking status: Never Smoker  . Smokeless tobacco: Never Used  Vaping Use  . Vaping Use: Never used  Substance and Sexual Activity   . Alcohol use: Not Currently    Comment: occ  . Drug use: No  . Sexual activity: Not Currently  Other Topics Concern  . Not on file  Social History Narrative  . Not on file   Social Determinants of Health   Financial Resource Strain:   . Difficulty of Paying Living Expenses: Not on file  Food Insecurity:   . Worried About RunChar[employer] fundraiser the Last Year: Not on file  . Ran Out of Food in the Last Year: Not on file  Transportation Needs:   . Lack of Transportation (Medical): Not on file  . Lack of Transportation (Non-Medical): Not on file  Physical Activity:   . Days of Exercise per Week: Not on file  . Minutes of Exercise per Session: Not on file  Stress:   . Feeling of Stress : Not on file  Social Connections:   . Frequency of Communication with Friends and Family: Not on file  . Frequency of Social Gatherings with Friends and Family: Not on file  . Attends Religious Services: Not on file  . Active Member of Clubs or Organizations: Not on file  . Attends CluArchivistetings: Not on file  .  Marital Status: Not on file    Family History  Problem Relation Age of Onset  . Colon cancer Mother   . COPD Mother   . Hypertension Mother   . Lung cancer Father     No Known Allergies  Medications Prior to Admission  Medication Sig Dispense Refill Last Dose  . acetaminophen (TYLENOL) 500 MG tablet Take 1 tablet (500 mg total) by mouth every 6 (six) hours as needed. 30 tablet 0 Past Month at Unknown time  . Prenatal Vit-Fe Fumarate-FA (PRENATAL COMPLETE) 14-0.4 MG TABS Take 1 tablet by mouth daily. 60 tablet 0 06/18/2020 at Unknown time  . Blood Pressure Monitoring (BLOOD PRESSURE KIT) DEVI 1 kit by Does not apply route as needed. 1 each 0   . famotidine (PEPCID) 20 MG tablet Take 1 tablet (20 mg total) by mouth 2 (two) times daily. (Patient not taking: Reported on 06/17/2020) 30 tablet 0   . promethazine (PHENERGAN) 25 MG tablet Take 1 tablet (25 mg total) by mouth  every 6 (six) hours as needed for nausea or vomiting. 30 tablet 0 More than a month at Unknown time    Review of Systems - Negative except vaginal bleeding.   Vitals:  BP 115/74 (BP Location: Right Arm)   Pulse 86   Resp 17   Ht 5' 3" (1.6 m)   Wt 74.2 kg   LMP 05/19/2020   SpO2 99%   BMI 28.98 kg/m  Physical Examination: CONSTITUTIONAL: Well-developed, well-nourished female in no acute distress.  HENT:  Normocephalic, atraumatic, External right and left ear normal. Oropharynx is clear and moist EYES: Conjunctivae and EOM are normal. Pupils are equal, round, and reactive to light. No scleral icterus.  NECK: Normal range of motion, supple, no masses SKIN: Skin is warm and dry. No rash noted. Not diaphoretic. No erythema. No pallor. [place]: Alert and oriented to person, place, and time. Normal reflexes, muscle tone coordination. No cranial nerve deficit noted. PSYCHIATRIC: Normal mood and affect. Normal behavior. Normal judgment and thought content. CARDIOVASCULAR: Normal heart rate noted, regular rhythm RESPIRATORY: Effort and breath sounds normal, no problems with respiration noted ABDOMEN: Soft, nontender, nondistended, gravid. MUSCULOSKELETAL: Normal range of motion. No edema and no tenderness. 2+ distal pulses.  Cervix: Not examined, NEFG: bright red blood in the os.  FHR is 157 by [place]  Labs:  Results for orders placed or performed during the hospital encounter of 06/18/20 (from the past 24 hour(s))  CBC   Collection Time: 06/18/20  6:12 PM  Result Value Ref Range   WBC 8.1 4.0 - 10.5 K/uL   RBC 3.14 (L) 3.87 - 5.11 MIL/uL   Hemoglobin 10.0 (L) 12.0 - 15.0 g/dL   HCT 29.6 (L) 36 - 46 %   MCV 94.3 80.0 - 100.0 fL   MCH 31.8 26.0 - 34.0 pg   MCHC 33.8 30.0 - 36.0 g/dL   RDW 13.0 11.5 - 15.5 %   Platelets 202 150 - 400 K/uL   nRBC 0.0 0.0 - 0.2 %    Imaging Studies: US OB Limited  Result Date: 06/10/2020 CLINICAL DATA:  Pelvic pain, bleeding EXAM: LIMITED OBSTETRIC  ULTRASOUND FINDINGS: Number of Fetuses: 1 Heart Rate:  147 bpm Movement: Yes Presentation: Cephalic Placental Location: Fundal Previa: Marginal. There is a 2.5 by 3.7 x 4.4 cm area of hemorrhage interposed between the placental margin and the internal cervical os. Amniotic Fluid (Subjective):  Normal BPD: 3.5 cm 16 w  6 d MATERNAL FINDINGS: Cervix:  Appears closed. Uterus/Adnexae: No abnormality visualized. IMPRESSION: 1. Single live intrauterine pregnancy as above, estimated age 90 weeks and 6 days. 2. Low lying placenta, marginal for placenta previa. There is a 2.5 x 3.7 x 4.4 cm subchorionic hemorrhage interposed within the inferior placental margin in the internal cervical os. This exam is performed on an emergent basis and does not comprehensively evaluate fetal size, dating, or anatomy; follow-up complete OB US should be considered if further fetal assessment is warranted. Electronically Signed   By: Randa Ngo M.D.   On: 06/10/2020 16:28     Assessment and Plan:  Patient Active Problem List   Diagnosis Date Noted  . History of cesarean delivery 06/18/2020  . Placenta previa 06/18/2020  . Subchorionic hematoma in second trimester 06/18/2020  . Supervision of other normal pregnancy, antepartum 06/17/2020  Per Dr. Ilda Basset,  -COVID test for admission -Will draw type and screen , abruption labs.  -Saline lock -Discuss with MFM in AM -Routine antenatal care  [place], CNM

## 2020-06-18 NOTE — MAU Provider Note (Addendum)
Chief Complaint: Vaginal Bleeding and Abdominal Pain   First Provider Initiated Contact with Patient 06/18/20 1741     SUBJECTIVE HPI: Tricia Walter is a 29 y.o. G3P1011 at 3w2dwho presents to Maternity Admissions reporting heavy vaginal bleeding. This is not a new problem, it has been going on since her pregnancy was confirmed with bleeding that has been intermittently light to heavy. She has been diagnosed with small subchorionic hemorrhages and a marginal placenta. Since overnight, her bleeding has been heavy with clots. She has soaked 6+ super tampons and pads. She denies cramping or abdominal pain.  Past Medical History:  Diagnosis Date  . Anemia   . Infection    UTI  . Migraine    OB History  Gravida Para Term Preterm AB Living  _0 SAB TAB Ectopic Multiple Live Births    1     1    # Outcome Date GA Lbr Len/2nd Weight Sex Delivery Anes PTL Lv  3 Current           2 TAB 03/2019 655w0d       1 Term 07/30/10 4031w1d lb 9 oz (3.884 kg) F CS-LTranv   LIV     Complications: Elective surgery   Past Surgical History:  Procedure Laterality Date  . CESAREAN SECTION     Social History   Socioeconomic History  . Marital status: Single    Spouse name: Not on file  . Number of children: Not on file  . Years of education: Not on file  . Highest education level: Not on file  Occupational History  . Not on file  Tobacco Use  . Smoking status: Never Smoker  . Smokeless tobacco: Never Used  Vaping Use  . Vaping Use: Never used  Substance and Sexual Activity  . Alcohol use: Not Currently    Comment: occ  . Drug use: No  . Sexual activity: Not Currently  Other Topics Concern  . Not on file  Social History Narrative  . Not on file   Social Determinants of Health   Financial Resource Strain:   . Difficulty of Paying Living Expenses: Not on file  Food Insecurity:   . Worried About [employer] fundraiser the Last Year: Not on file  . Ran Out of Food in the  Last Year: Not on file  Transportation Needs:   . Lack of Transportation (Medical): Not on file  . Lack of Transportation (Non-Medical): Not on file  Physical Activity:   . Days of Exercise per Week: Not on file  . Minutes of Exercise per Session: Not on file  Stress:   . Feeling of Stress : Not on file  Social Connections:   . Frequency of Communication with Friends and Family: Not on file  . Frequency of Social Gatherings with Friends and Family: Not on file  . Attends Religious Services: Not on file  . Active Member of Clubs or Organizations: Not on file  . Attends CluArchivistetings: Not on file  . Marital Status: Not on file  Intimate Partner Violence:   . Fear of Current or Ex-Partner: Not on file  . Emotionally Abused: Not on file  . Physically Abused: Not on file  . Sexually Abused: Not on file   Family History  Problem Relation Age of Onset  . Colon cancer Mother   . COPD Mother   . Hypertension Mother   .  Lung cancer Father    No current facility-administered medications on file prior to encounter.   Current Outpatient Medications on File Prior to Encounter  Medication Sig Dispense Refill  . acetaminophen (TYLENOL) 500 MG tablet Take 1 tablet (500 mg total) by mouth every 6 (six) hours as needed. 30 tablet 0  . Prenatal Vit-Fe Fumarate-FA (PRENATAL COMPLETE) 14-0.4 MG TABS Take 1 tablet by mouth daily. 60 tablet 0  . Blood Pressure Monitoring (BLOOD PRESSURE KIT) DEVI 1 kit by Does not apply route as needed. 1 each 0  . famotidine (PEPCID) 20 MG tablet Take 1 tablet (20 mg total) by mouth 2 (two) times daily. (Patient not taking: Reported on 06/17/2020) 30 tablet 0  . promethazine (PHENERGAN) 25 MG tablet Take 1 tablet (25 mg total) by mouth every 6 (six) hours as needed for nausea or vomiting. 30 tablet 0   No Known Allergies  I have reviewed patient's Past Medical Hx, Surgical Hx, Family Hx, Social Hx, medications and allergies.   Review of Systems   Constitutional: Negative for fatigue and fever.  Respiratory: Negative for cough and shortness of breath.   Gastrointestinal: Negative for abdominal pain, constipation, diarrhea, nausea and vomiting.  Genitourinary: Positive for vaginal bleeding. Negative for pelvic pain.  Neurological: Negative for dizziness, weakness, light-headedness and headaches.  All other systems reviewed and are negative.   OBJECTIVE Patient Vitals for the past 24 hrs:  BP Temp src Pulse Resp SpO2 Height Weight  06/18/20 1635 115/74 Oral 86 17 99 % $Re'5\' 3"'NvA$  (1.6 m) 163 lb 9.6 oz (74.2 kg)   Constitutional: Well-developed, well-nourished female in no acute distress.  Cardiovascular: normal rate & rhythm, no murmur Respiratory: normal rate and effort. Lung sounds clear throughout GI: Abd soft, non-tender, Pos BS x 4. No guarding or rebound tenderness MS: Extremities nontender, no edema, normal ROM Neurologic: Alert and oriented x 4.  SPECULUM EXAM: normal vulva, no discharge noted. Large clot pulled from introitus and slow trickle from os noted.   LAB RESULTS Results for orders placed or performed during the hospital encounter of 06/18/20 (from the past 24 hour(s))  CBC     Status: Abnormal   Collection Time: 06/18/20  6:12 PM  Result Value Ref Range   WBC 8.1 4.0 - 10.5 K/uL   RBC 3.14 (L) 3.87 - 5.11 MIL/uL   Hemoglobin 10.0 (L) 12.0 - 15.0 g/dL   HCT 29.6 (L) 36 - 46 %   MCV 94.3 80.0 - 100.0 fL   MCH 31.8 26.0 - 34.0 pg   MCHC 33.8 30.0 - 36.0 g/dL   RDW 13.0 11.5 - 15.5 %   Platelets 202 150 - 400 K/uL   nRBC 0.0 0.0 - 0.2 %    IMAGING No results found.  MAU COURSE Orders Placed This Encounter  Procedures  . [place] MFM OB LIMITED  . [place] MFM OB TRANSVAGINAL  . CBC   No orders of the defined types were placed in this encounter.   MDM Hgb dropped 11.7 on 06/10/20 to 10.0 today Pt sent for repeat U/S and cervical length  Report given to Maye Hides, CNM at 2026  Gaylan Gerold, [place], MSN,  [facility] 06/18/20 8:26 PM    Patient care assumed at 2026;   -Repeat speculum exam shows too much bleeding for amnisure -Reviewed US findings with Dr. Ilda Basset and patient's exam -Dr. Ilda Basset recommends admission, COVId test, abruption labs.  -Patient amenable to admission to [facility] -RN aware and will insert IV  and draw labs  Maye Hides

## 2020-06-19 DIAGNOSIS — O4432 Partial placenta previa with hemorrhage, second trimester: Secondary | ICD-10-CM

## 2020-06-19 DIAGNOSIS — O99012 Anemia complicating pregnancy, second trimester: Secondary | ICD-10-CM

## 2020-06-19 DIAGNOSIS — O99019 Anemia complicating pregnancy, unspecified trimester: Secondary | ICD-10-CM | POA: Diagnosis present

## 2020-06-19 DIAGNOSIS — Z3A17 17 weeks gestation of pregnancy: Secondary | ICD-10-CM

## 2020-06-19 DIAGNOSIS — O4402 Placenta previa specified as without hemorrhage, second trimester: Secondary | ICD-10-CM

## 2020-06-19 DIAGNOSIS — D649 Anemia, unspecified: Secondary | ICD-10-CM

## 2020-06-19 LAB — CBC/D/PLT+RPR+RH+ABO+RUB AB...
Antibody Screen: NEGATIVE
Basophils Absolute: 0 10*3/uL (ref 0.0–0.2)
Basos: 0 %
EOS (ABSOLUTE): 0 10*3/uL (ref 0.0–0.4)
Eos: 0 %
HCV Ab: <0.1 s/co ratio (ref 0.0–0.9)
HIV Screen 4th Generation wRfx: NONREACTIVE
Hematocrit: 34.4 % (ref 34.0–46.6)
Hemoglobin: 11.5 g/dL (ref 11.1–15.9)
Hepatitis B Surface Ag: NEGATIVE
Immature Grans (Abs): 0 10*3/uL (ref 0.0–0.1)
Immature Granulocytes: 1 %
Lymphocytes Absolute: 2 10*3/uL (ref 0.7–3.1)
Lymphs: 28 %
MCH: 31.8 pg (ref 26.6–33.0)
MCHC: 33.4 g/dL (ref 31.5–35.7)
MCV: 95 fL (ref 79–97)
Monocytes Absolute: 0.5 10*3/uL (ref 0.1–0.9)
Monocytes: 7 %
Neutrophils Absolute: 4.5 10*3/uL (ref 1.4–7.0)
Neutrophils: 64 %
Platelets: 213 10*3/uL (ref 150–450)
RBC: 3.62 x10E6/uL — ABNORMAL LOW (ref 3.77–5.28)
RDW: 12.8 % (ref 11.7–15.4)
RPR Ser Ql: NONREACTIVE
Rh Factor: POSITIVE
Rubella Antibodies, IGG: 2.09 index (ref >0.99)
WBC: 7 10*3/uL (ref 3.4–10.8)

## 2020-06-19 LAB — AFP, SERUM, OPEN SPINA BIFIDA
AFP MoM: 3.68
AFP Value: 146.7 ng/mL
Gest. Age on Collection Date: 17 weeks
Maternal Age At EDD: 27.7 yr
OSBR Risk 1 IN: 82
Test Results:: POSITIVE — AB
Weight: 165 [lb_av]

## 2020-06-19 LAB — KLEIHAUER-BETKE STAIN
# Vials RhIg: 1
Fetal Cells %: 0 %
Quantitation Fetal Hemoglobin: 0 mL

## 2020-06-19 LAB — CBC
HCT: 28.3 % — ABNORMAL LOW (ref 36.0–46.0)
Hemoglobin: 9.8 g/dL — ABNORMAL LOW (ref 12.0–15.0)
MCH: 32.2 pg (ref 26.0–34.0)
MCHC: 34.6 g/dL (ref 30.0–36.0)
MCV: 93.1 fL (ref 80.0–100.0)
Platelets: 179 10*3/uL (ref 150–400)
RBC: 3.04 MIL/uL — ABNORMAL LOW (ref 3.87–5.11)
RDW: 13 % (ref 11.5–15.5)
WBC: 6.9 10*3/uL (ref 4.0–10.5)
nRBC: 0 % (ref 0.0–0.2)

## 2020-06-19 LAB — FIBRINOGEN: Fibrinogen: 396 mg/dL (ref 210–475)

## 2020-06-19 LAB — PROTIME-INR
INR: 1 (ref 0.8–1.2)
Prothrombin Time: 12.7 seconds (ref 11.4–15.2)

## 2020-06-19 LAB — TYPE AND SCREEN
ABO/RH(D): O POS
Antibody Screen: NEGATIVE

## 2020-06-19 LAB — HCV INTERPRETATION

## 2020-06-19 LAB — APTT: aPTT: 28 seconds (ref 24–36)

## 2020-06-19 MED ORDER — SODIUM CHLORIDE 0.9 % IV SOLN
510.0000 mg | Freq: Once | INTRAVENOUS | Status: AC
  Administered 2020-06-19: 510 mg via INTRAVENOUS
  Filled 2020-06-19: qty 17

## 2020-06-19 MED ORDER — PRENATAL COMPLETE 14-0.4 MG PO TABS: 1.0000 | ORAL_TABLET | Freq: Every day | ORAL | 3 refills | Status: AC

## 2020-06-19 NOTE — Discharge Summary (Signed)
Physician Discharge Summary  Patient ID: Tricia Walter MRN: [id number] DOB/AGE: 04/08/1991 29 y.o.  Admit date: 06/18/2020 Discharge date: 06/19/2020  Admission Diagnoses: Principal Problem:   Placenta previa Active Problems:   History of cesarean delivery   Subchorionic hematoma in second trimester   Vaginal bleeding before [redacted] weeks gestation   Anemia in pregnancy  Discharge Diagnoses:  Principal Problem:   Placenta previa Active Problems:   History of cesarean delivery   Subchorionic hematoma in second trimester   Vaginal bleeding before [redacted] weeks gestation   Anemia in pregnancy   Discharged Condition: good  Hospital Course: Pt was admitted on 10/1 after vaginal bleeding in setting of known placenta previa and subchorionic hemorrhage.  Ultrasound showed the subchorionic hemorrhage and slightly low AFI.  There were no other signs of possible SROM.  Pt was stable the remainder of the evening and was reassessed on 10/2.  The vaginal bleeding had decreased and was only spotting by the after noon.  Subchorionic hemorrhage and placenta previa were discussed with the pt before her discharge.  She was advised to have a follow up this week with possible evaluation by MFM later.  She does have u/s scheduled for 07/02/20.  Consults: None  Significant Diagnostic Studies: radiology: Ultrasound: vaginal ultrasound  Treatments: ultrasound and expectant management  Discharge Exam: Blood pressure 104/66, pulse 74, temperature 98.4 F (36.9 C), temperature source Oral, resp. rate 18, height 5' 3" (1.6 m), weight 74.2 kg, last menstrual period 05/19/2020, SpO2 97 %, unknown if currently breastfeeding. before discharge pad and external genetalia were examined with chaperone.  No active bleeding noted and current pad on had light spotting  Disposition: Discharge disposition: 01-Home or Self Care       Discharge Instructions    Discharge activity:   Complete by: As directed    Nothing  more than activities of daily living   Discharge activity:  No Restrictions   Complete by: As directed    Discharge diet:  No restrictions   Complete by: As directed    Discharge instructions   Complete by: As directed    Call MD for any intense abdominal cramping or any vaginal bleeding more than spotting   Do not have sex or do anything that might make you have an orgasm   Complete by: As directed      Allergies as of 06/19/2020   No Known Allergies     Medication List    STOP taking these medications   famotidine 20 MG tablet Commonly known as: PEPCID   promethazine 25 MG tablet Commonly known as: PHENERGAN     TAKE these medications   acetaminophen 500 MG tablet Commonly known as: TYLENOL Take 1 tablet (500 mg total) by mouth every 6 (six) hours as needed.   Blood Pressure Kit Devi 1 kit by Does not apply route as needed.   Prenatal Complete 14-0.4 MG Tabs Take 1 tablet by mouth daily.       Follow-up Information    [facility]. Schedule an appointment as soon as possible for a visit in 5 day(s).   Specialty: Obstetrics and Gynecology Why: any MD Contact information: [address] [place] [address] [phone]              Signed: Lawrence A Bass 06/19/2020, 3:17 PM  

## 2020-06-19 NOTE — Discharge Instructions (Signed)
Subchorionic Hematoma  A subchorionic hematoma is a gathering of blood between the outer wall of the embryo (chorion) and the inner wall of the womb (uterus). This condition can cause vaginal bleeding. If they cause little or no vaginal bleeding, early small hematomas usually shrink on their own and do not affect your baby or pregnancy. When bleeding starts later in pregnancy, or if the hematoma is larger or occurs in older pregnant women, the condition may be more serious. Larger hematomas may get bigger, which increases the chances of miscarriage. This condition also increases the risk of:  Premature separation of the placenta from the uterus.  Premature (preterm) labor.  Stillbirth. What are the causes? The exact cause of this condition is not known. It occurs when blood is trapped between the placenta and the uterine wall because the placenta has separated from the original site of implantation. What increases the risk? You are more likely to develop this condition if:  You were treated with fertility medicines.  You conceived through in vitro fertilization (IVF). What are the signs or symptoms? Symptoms of this condition include:  Vaginal spotting or bleeding.  Contractions of the uterus. These cause abdominal pain. Sometimes you may have no symptoms and the bleeding may only be seen when ultrasound images are taken (transvaginal ultrasound). How is this diagnosed? This condition is diagnosed based on a physical exam. This includes a pelvic exam. You may also have other tests, including:  Blood tests.  Urine tests.  Ultrasound of the abdomen. How is this treated? Treatment for this condition can vary. Treatment may include:  Watchful waiting. You will be monitored closely for any changes in bleeding. During this stage: ? The hematoma may be reabsorbed by the body. ? The hematoma may separate the fluid-filled space containing the embryo (gestational sac) from the wall of the  womb (endometrium).  Medicines.  Activity restriction. This may be needed until the bleeding stops. Follow these instructions at home:  Stay on bed rest if told to do so by your health care provider.  Do not lift anything that is heavier than 10 lbs. (4.5 kg) or as told by your health care provider.  Do not use any products that contain nicotine or tobacco, such as cigarettes and e-cigarettes. If you need help quitting, ask your health care provider.  Track and write down the number of pads you use each day and how soaked (saturated) they are.  Do not use tampons.  Keep all follow-up visits as told by your health care provider. This is important. Your health care provider may ask you to have follow-up blood tests or ultrasound tests or both. Contact a health care provider if:  You have any vaginal bleeding.  You have a fever. Get help right away if:  You have severe cramps in your stomach, back, abdomen, or pelvis.  You pass large clots or tissue. Save any tissue for your health care provider to look at.  You have more vaginal bleeding, and you faint or become lightheaded or weak. Summary  A subchorionic hematoma is a gathering of blood between the outer wall of the placenta and the uterus.  This condition can cause vaginal bleeding.  Sometimes you may have no symptoms and the bleeding may only be seen when ultrasound images are taken.  Treatment may include watchful waiting, medicines, or activity restriction. This information is not intended to replace advice given to you by your health care provider. Make sure you discuss any questions you   have with your health care provider. Document Revised: 08/17/2017 Document Reviewed: 10/31/2016 Elsevier Patient Education  2020 Elsevier Inc.  Placenta Previa Placenta previa is a condition in which the placenta implants in the lower part of the uterus in pregnant women. The placenta either partially or completely covers the opening  to the cervix. This is a problem because the baby must pass through the cervix during delivery. There are three types of placenta previa:  Marginal placenta previa. The placenta reaches within an inch (2.5 cm) of the cervical opening but does not cover it.  Partial placenta previa. The placenta covers part of the cervical opening.  Complete placenta previa. The placenta covers the entire cervical opening. If the previa is marginal or partial and it is diagnosed in the first half of pregnancy, the placenta may move into a normal position as the pregnancy progresses and may no longer cover the cervix. It is important to keep all prenatal visits with your health care provider so you can be more closely monitored. What are the causes? The cause of this condition is not known. What increases the risk? This condition is more likely to develop in women who:  Are carrying more than one baby (multiples).  Have an abnormally shaped uterus.  Have scars on the lining of the uterus.  Have had surgeries involving the uterus, such as a cesarean delivery.  Have delivered a baby before.  Have a history of placenta previa.  Have smoked or used cocaine during pregnancy.  Are age 38 or older during pregnancy. What are the signs or symptoms? The main symptom of this condition is sudden, painless vaginal bleeding during the second half of pregnancy. The amount of bleeding can be very light at first, and it usually stops on its own. Heavier bleeding episodes may also happen. Some women with placenta previa may have no bleeding at all. How is this diagnosed?  This condition is diagnosed: ? From an ultrasound. This test uses sound waves to find where the placenta is located before you have any bleeding episodes. ? During a checkup after vaginal bleeding is noticed.  If you are diagnosed with a partial or complete previa, digital exams with fingers will generally be avoided. Your health care provider will  still perform a speculum exam.  If you did not have an ultrasound during your pregnancy, placenta previa may not be diagnosed until bleeding occurs during labor. How is this treated? Treatment for this condition may include:  Decreased activity.  Bed rest at home or in the hospital.  Pelvic rest. Nothing is placed inside the vagina during pelvic rest. This means not having sex and not using tampons or douches.  A blood transfusion to replace blood that you have lost (maternal blood loss).  A cesarean delivery. This may be performed if: ? The bleeding is heavy and cannot be controlled. ? The placenta completely covers the cervix.  Medicines to stop premature labor or to help the baby's lungs to mature. This treatment may be used if you need delivery before your pregnancy is full-term. Your treatment will be decided based on:  How much you are bleeding, or whether the bleeding has stopped.  How far along you are in your pregnancy.  The condition of your baby.  The type of placenta previa that you have.  Follow these instructions at home:  Get plenty of rest and lessen activity as told by your health care provider.  Stay on bed rest for as long as  told by your health care provider.  Do not have sex, use tampons, use a douche, or place anything inside of your vagina if your health care provider recommended pelvic rest.  Take over-the-counter and prescription medicines as told by your health care provider.  Keep all follow-up visits as told by your health care provider. This is important. Get help right away if:  You have vaginal bleeding, even if in small amounts and even if you have no pain.  You have cramping or regular contractions.  You have pain in your abdomen or your lower back.  You have a feeling of increased pressure in your pelvis.  You have increased watery or bloody mucus from the vagina. This information is not intended to replace advice given to you by  your health care provider. Make sure you discuss any questions you have with your health care provider. Document Revised: 08/17/2017 Document Reviewed: 03/18/2016 Elsevier Patient Education  2020 [employer].

## 2020-06-19 NOTE — Progress Notes (Signed)
Daily Antepartum Note  Admission Date: 06/18/2020 Current Date: 06/19/2020 7:55 AM  Tricia Walter is a 29 y.o. G3P1011 @ [redacted]w[redacted]d by, HD#2, admitted for VB in known previa.  Pregnancy complicated by: Patient Active Problem List   Diagnosis Date Noted  . History of cesarean delivery 06/18/2020  . Placenta previa 06/18/2020  . Subchorionic hematoma in second trimester 06/18/2020  . Vaginal bleeding before [redacted] weeks gestation 06/18/2020  . Supervision of other normal pregnancy, antepartum 06/17/2020    Overnight/24hr events:  none  Subjective:  VB has lightened up. Still no pain.   Objective:    Current Vital Signs 24h Vital Sign Ranges  T 98.2 F (36.8 C) Temp  Avg: 98.2 F (36.8 C)  Min: 98.2 F (36.8 C)  Max: 98.2 F (36.8 C)  BP 99/62 BP  Min: 99/62  Max: 115/74  HR 86 Pulse  Avg: 85.3  Min: 84  Max: 86  RR 18 Resp  Avg: 17.3  Min: 17  Max: 18  SaO2 97 % Room Air SpO2  Avg: 98.7 %  Min: 97 %  Max: 100 %       24 Hour I/O Current Shift I/O  Time Ins Outs 10/01 0701 - 10/02 0700 In: -  Out: 900 [Urine:900] No intake/output data recorded.    Physical exam: General: Well nourished, well developed female in no acute distress. Abdomen: nttp Cardiovascular: S1, S2 normal, no murmur, rub or gallop, regular rate and rhythm Respiratory: CTAB Extremities: no clubbing, cyanosis or edema Skin: Warm and dry.   Medications: Current Facility-Administered Medications  Medication Dose Route Frequency Provider Last Rate Last Admin  . acetaminophen (TYLENOL) tablet 650 mg  650 mg Oral Q4H PRN [place] Bing, MD   650 mg at 06/19/20 0535  . calcium carbonate (TUMS - dosed in mg elemental calcium) chewable tablet 400 mg of elemental calcium  2 tablet Oral Q4H PRN [place] Bing, MD   400 mg of elemental calcium at 06/18/20 2337  . docusate sodium (COLACE) capsule 100 mg  100 mg Oral BID PRN [place] Bing, MD      . prenatal multivitamin tablet 1 tablet  1 tablet Oral Q1200  [place] Bing, MD      . zolpidem (AMBIEN) tablet 5 mg  5 mg Oral QHS PRN Blanca Bing, MD        Labs:  Recent Labs  Lab 06/17/20 1142 06/18/20 1812 06/19/20 0027  WBC 7.0 8.1 6.9  HGB 11.5 10.0* 9.8*  HCT 34.4 29.6* 28.3*  PLT 213 202 179   O POS  Negative: KB, PT/PTT, fibrinogen (396)  Radiology:  Final read shows ?low AF  Assessment & Plan:  Pt improving *Pregnancy: qshift FHTs. Follow up NOB labs *VB: pads overnight examined and 3 pads non saturated, old blood and on approximately 15% of pad. I told her if continues to lighten up and no pain then likely okay for d/c later this afternoon.  *Anemia: pt amenable to IV iron *PPx: SCDs, OOB ad lib *FEN/GI: regular diet, SLIV *Dispo: potentially later this afternoon  Cornelia Copa MD Attending Center for [facility] (Faculty Practice) GYN Consult Phone: [phone] (M-F, 0800-1700) & [phone] (Off hours, weekends, holidays)

## 2020-06-19 NOTE — Progress Notes (Signed)
Pt discharged after discharge instructions given. All questions answered. Pt verbalized understanding. Pt in stable condition and sent with all belongings. No further bleeding, only small spotting, noted today.

## 2020-06-20 LAB — CULTURE, OB URINE

## 2020-06-20 LAB — URINE CULTURE, OB REFLEX

## 2020-06-22 ENCOUNTER — Encounter: Payer: Self-pay | Admitting: Obstetrics & Gynecology

## 2020-06-24 ENCOUNTER — Ambulatory Visit (INDEPENDENT_AMBULATORY_CARE_PROVIDER_SITE_OTHER): Payer: Medicaid Other | Admitting: Obstetrics and Gynecology

## 2020-06-24 ENCOUNTER — Encounter: Payer: Self-pay | Admitting: Obstetrics and Gynecology

## 2020-06-24 ENCOUNTER — Other Ambulatory Visit: Payer: Self-pay

## 2020-06-24 VITALS — BP 108/68 | HR 102 | Wt 162.0 lb

## 2020-06-24 DIAGNOSIS — Z98891 History of uterine scar from previous surgery: Secondary | ICD-10-CM

## 2020-06-24 DIAGNOSIS — O0990 Supervision of high risk pregnancy, unspecified, unspecified trimester: Secondary | ICD-10-CM

## 2020-06-24 DIAGNOSIS — O4402 Placenta previa specified as without hemorrhage, second trimester: Secondary | ICD-10-CM

## 2020-06-24 DIAGNOSIS — O99012 Anemia complicating pregnancy, second trimester: Secondary | ICD-10-CM

## 2020-06-24 MED ORDER — SODIUM CHLORIDE 0.9 % IV SOLN: 510.0000 mg | Freq: Once | INTRAVENOUS | Status: DC

## 2020-06-24 NOTE — Progress Notes (Signed)
   PRENATAL VISIT NOTE  Subjective:  Tricia Walter is a 29 y.o. G3P1011 at [redacted]w[redacted]d being seen today for ongoing prenatal care.  She is currently monitored for the following issues for this high-risk pregnancy and has Supervision of high risk pregnancy, antepartum; History of cesarean delivery; Placenta previa; Subchorionic hematoma in second trimester; Vaginal bleeding before [redacted] weeks gestation; and Anemia in pregnancy on their problem list.  Patient reports bleeding.  Contractions: Not present. Vag. Bleeding: Moderate.  Movement: Present. Denies leaking of fluid.   The following portions of the patient's history were reviewed and updated as appropriate: allergies, current medications, past family history, past medical history, past social history, past surgical history and problem list.   Objective:   Vitals:   06/24/20 1603  BP: 108/68  Pulse: (!) 102  Weight: 162 lb (73.5 kg)    Fetal Status: Fetal Heart Rate (bpm): 147   Movement: Present     General:  Alert, oriented and cooperative. Patient is in no acute distress.  Skin: Skin is warm and dry. No rash noted.   Cardiovascular: Normal heart rate noted  Respiratory: Normal respiratory effort, no problems with respiration noted  Abdomen: Soft, gravid, appropriate for gestational age.  Pain/Pressure: Present     Pelvic: SSE: scant blood in vault. No active bleeding from os        Extremities: Normal range of motion.  Edema: None  Mental Status: Normal mood and affect. Normal behavior. Normal judgment and thought content.   Assessment and Plan:  Pregnancy: G3P1011 at [redacted]w[redacted]d 1. Supervision of high risk pregnancy, antepartum Patient is doing well  2. Placenta previa in second trimester Follow up ultrasound 10/15 Previa precautions reviewed Reassuring exam today. Patient reports and showed pictures of saturated pad from yesterday. 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 Advised patient to ensure that she continues to modify her activities at home as well  3. Anemia during pregnancy in second trimester Feraheme ordered (second dose)  4. History of cesarean delivery Desires TOLAC  Preterm labor symptoms and general obstetric precautions including but not limited to vaginal bleeding, contractions, leaking of fluid and fetal movement were reviewed in detail with the patient. Please refer to After Visit Summary for other counseling recommendations.   No follow-ups on file.  Future Appointments  Date Time Provider Department Center  07/02/2020  8:15 AM [facility] NURSE [facility]  07/02/2020  8:30 AM [facility] US3 [facility]  07/15/2020  8:30 AM Marny Lowenstein, PA-C [facility] None    Catalina Antigua, MD

## 2020-06-24 NOTE — Progress Notes (Signed)
HOB FU c/o moderate bleeding and pain 3-10/10

## 2020-06-28 ENCOUNTER — Encounter: Payer: Self-pay | Admitting: Obstetrics & Gynecology

## 2020-06-28 DIAGNOSIS — O0990 Supervision of high risk pregnancy, unspecified, unspecified trimester: Secondary | ICD-10-CM

## 2020-06-29 ENCOUNTER — Other Ambulatory Visit: Payer: Self-pay

## 2020-06-29 MED ORDER — NITROFURANTOIN MONOHYD MACRO 100 MG PO CAPS: 100.0000 mg | ORAL_CAPSULE | Freq: Two times a day (BID) | ORAL | 0 refills | Status: DC

## 2020-06-29 NOTE — Progress Notes (Signed)
Macrobid rx sent to pt pharmacy per Dr. Debroah Loop.

## 2020-06-30 ENCOUNTER — Telehealth: Payer: Self-pay

## 2020-06-30 NOTE — Telephone Encounter (Signed)
Patient feraheme infusion has been ordered and patient is schedule for 07/07/2020

## 2020-07-02 ENCOUNTER — Ambulatory Visit: Payer: Medicaid Other | Attending: Obstetrics & Gynecology

## 2020-07-02 ENCOUNTER — Other Ambulatory Visit: Payer: Self-pay

## 2020-07-02 ENCOUNTER — Other Ambulatory Visit: Payer: Self-pay | Admitting: *Deleted

## 2020-07-02 ENCOUNTER — Telehealth: Payer: Self-pay

## 2020-07-02 ENCOUNTER — Ambulatory Visit: Payer: Medicaid Other | Admitting: *Deleted

## 2020-07-02 DIAGNOSIS — O0990 Supervision of high risk pregnancy, unspecified, unspecified trimester: Secondary | ICD-10-CM | POA: Diagnosis present

## 2020-07-02 DIAGNOSIS — O4102X0 Oligohydramnios, second trimester, not applicable or unspecified: Secondary | ICD-10-CM

## 2020-07-02 DIAGNOSIS — O4592 Premature separation of placenta, unspecified, second trimester: Secondary | ICD-10-CM

## 2020-07-02 DIAGNOSIS — O4692 Antepartum hemorrhage, unspecified, second trimester: Secondary | ICD-10-CM | POA: Diagnosis not present

## 2020-07-02 DIAGNOSIS — O418X20 Other specified disorders of amniotic fluid and membranes, second trimester, not applicable or unspecified: Secondary | ICD-10-CM | POA: Diagnosis not present

## 2020-07-02 DIAGNOSIS — Z3480 Encounter for supervision of other normal pregnancy, unspecified trimester: Secondary | ICD-10-CM | POA: Diagnosis not present

## 2020-07-02 DIAGNOSIS — Z3A20 20 weeks gestation of pregnancy: Secondary | ICD-10-CM

## 2020-07-02 DIAGNOSIS — O468X2 Other antepartum hemorrhage, second trimester: Secondary | ICD-10-CM | POA: Insufficient documentation

## 2020-07-02 DIAGNOSIS — O34212 Maternal care for vertical scar from previous cesarean delivery: Secondary | ICD-10-CM | POA: Diagnosis not present

## 2020-07-02 NOTE — Telephone Encounter (Signed)
Pt was notified this morning at her anatomy scan [place] that there is no amniotic fluid. She called the office and has some further questions and needs to speak with a provider. I called MFM to speak to the provider due to the report not being available yet. Dr. Parke Poisson reviewed the [place] results with me. I contacted office manager Loyce Dys to set up a visit or call with a provider to talk to the patient and gave Dr. Loretta Plume personal cell phone number if the provider has questions. Called the pt back to let her know that someone will be contacting her, pt voices understanding.

## 2020-07-06 ENCOUNTER — Ambulatory Visit (INDEPENDENT_AMBULATORY_CARE_PROVIDER_SITE_OTHER): Payer: Medicaid Other | Admitting: Obstetrics and Gynecology

## 2020-07-06 ENCOUNTER — Other Ambulatory Visit: Payer: Self-pay

## 2020-07-06 ENCOUNTER — Encounter: Payer: Self-pay | Admitting: Obstetrics and Gynecology

## 2020-07-06 VITALS — BP 110/65 | HR 91 | Wt 163.0 lb

## 2020-07-06 DIAGNOSIS — O0990 Supervision of high risk pregnancy, unspecified, unspecified trimester: Secondary | ICD-10-CM

## 2020-07-06 DIAGNOSIS — O4402 Placenta previa specified as without hemorrhage, second trimester: Secondary | ICD-10-CM

## 2020-07-06 DIAGNOSIS — O4102X0 Oligohydramnios, second trimester, not applicable or unspecified: Secondary | ICD-10-CM

## 2020-07-06 DIAGNOSIS — Z98891 History of uterine scar from previous surgery: Secondary | ICD-10-CM

## 2020-07-06 NOTE — Progress Notes (Signed)
   PRENATAL VISIT NOTE  Subjective:  Tricia Walter is a 29 y.o. G3P1011 at [redacted]w[redacted]d being seen today for ongoing prenatal care.  She is currently monitored for the following issues for this high-risk pregnancy and has Supervision of high risk pregnancy, antepartum; History of cesarean delivery; Placenta previa; Subchorionic hematoma in second trimester; Vaginal bleeding before [redacted] weeks gestation; Anemia in pregnancy; and Anhydramnios in second trimester on their problem list.  Patient reports no complaints.  Contractions: Not present. Vag. Bleeding: Scant.  Movement: Present. Denies leaking of fluid.   The following portions of the patient's history were reviewed and updated as appropriate: allergies, current medications, past family history, past medical history, past social history, past surgical history and problem list.   Objective:   Vitals:   07/06/20 1409  BP: 110/65  Pulse: 91  Weight: 163 lb (73.9 kg)    Fetal Status: Fetal Heart Rate (bpm): 165   Movement: Present     General:  Alert, oriented and cooperative. Patient is in no acute distress.  Skin: Skin is warm and dry. No rash noted.   Cardiovascular: Normal heart rate noted  Respiratory: Normal respiratory effort, no problems with respiration noted  Abdomen: Soft, gravid, appropriate for gestational age.  Pain/Pressure: Absent     Pelvic: Cervical exam deferred        Extremities: Normal range of motion.  Edema: None  Mental Status: Normal mood and affect. Normal behavior. Normal judgment and thought content.   Assessment and Plan:  Pregnancy: G3P1011 at [redacted]w[redacted]d 1. Supervision of high risk pregnancy, antepartum Patient is doing well considering circumstances   2. History of cesarean delivery   3. Placenta previa in second trimester Patient reports some spotting when wiping  4. Anhydramnios in second trimester, single or unspecified fetus Patient with suspected PPROM Discussed expectant management until 23 weeks,  at which point patient is to be hospitalized until delivery Discussed si/sx of chorio and reasons to present to MAU Patient desires weekly visits for reassurance until 23 week admission  Preterm labor symptoms and general obstetric precautions including but not limited to vaginal bleeding, contractions, leaking of fluid and fetal movement were reviewed in detail with the patient. Please refer to After Visit Summary for other counseling recommendations.   No follow-ups on file.  Future Appointments  Date Time Provider Department Center  07/09/2020  2:00 PM [facility] None  07/12/2020  7:15 AM [facility] NURSE [facility]  07/12/2020  7:30 AM [facility] US2 [facility]  07/15/2020  8:30 AM Magnus Sinning, [place], PA-C [facility] None    Catalina Antigua, MD

## 2020-07-06 NOTE — Progress Notes (Signed)
Pt reports fetal movement with some pink spotting today.

## 2020-07-07 ENCOUNTER — Encounter (HOSPITAL_COMMUNITY): Payer: Medicaid Other

## 2020-07-09 ENCOUNTER — Inpatient Hospital Stay (HOSPITAL_COMMUNITY): Admission: RE | Admit: 2020-07-09 | Payer: Medicaid Other | Source: Ambulatory Visit

## 2020-07-12 ENCOUNTER — Ambulatory Visit: Payer: Medicaid Other | Attending: Obstetrics and Gynecology

## 2020-07-12 ENCOUNTER — Other Ambulatory Visit: Payer: Self-pay

## 2020-07-12 ENCOUNTER — Encounter: Payer: Self-pay | Admitting: *Deleted

## 2020-07-12 ENCOUNTER — Ambulatory Visit: Payer: Medicaid Other | Admitting: *Deleted

## 2020-07-12 DIAGNOSIS — O4592 Premature separation of placenta, unspecified, second trimester: Secondary | ICD-10-CM

## 2020-07-12 DIAGNOSIS — O4102X0 Oligohydramnios, second trimester, not applicable or unspecified: Secondary | ICD-10-CM

## 2020-07-12 DIAGNOSIS — O46022 Antepartum hemorrhage with disseminated intravascular coagulation, second trimester: Secondary | ICD-10-CM

## 2020-07-12 DIAGNOSIS — Z361 Encounter for antenatal screening for raised alphafetoprotein level: Secondary | ICD-10-CM

## 2020-07-12 DIAGNOSIS — Z3A19 19 weeks gestation of pregnancy: Secondary | ICD-10-CM | POA: Diagnosis not present

## 2020-07-12 DIAGNOSIS — Z3A20 20 weeks gestation of pregnancy: Secondary | ICD-10-CM

## 2020-07-12 DIAGNOSIS — O0990 Supervision of high risk pregnancy, unspecified, unspecified trimester: Secondary | ICD-10-CM

## 2020-07-12 DIAGNOSIS — O4692 Antepartum hemorrhage, unspecified, second trimester: Secondary | ICD-10-CM | POA: Diagnosis not present

## 2020-07-12 DIAGNOSIS — O34219 Maternal care for unspecified type scar from previous cesarean delivery: Secondary | ICD-10-CM

## 2020-07-13 ENCOUNTER — Ambulatory Visit (INDEPENDENT_AMBULATORY_CARE_PROVIDER_SITE_OTHER): Payer: Medicaid Other | Admitting: Obstetrics and Gynecology

## 2020-07-13 ENCOUNTER — Encounter: Payer: Self-pay | Admitting: Obstetrics and Gynecology

## 2020-07-13 VITALS — BP 108/65 | HR 73 | Wt 165.1 lb

## 2020-07-13 DIAGNOSIS — O4102X1 Oligohydramnios, second trimester, fetus 1: Secondary | ICD-10-CM

## 2020-07-13 DIAGNOSIS — Z3A20 20 weeks gestation of pregnancy: Secondary | ICD-10-CM

## 2020-07-13 DIAGNOSIS — O09892 Supervision of other high risk pregnancies, second trimester: Secondary | ICD-10-CM

## 2020-07-13 DIAGNOSIS — O0990 Supervision of high risk pregnancy, unspecified, unspecified trimester: Secondary | ICD-10-CM

## 2020-07-13 NOTE — Progress Notes (Signed)
   PRENATAL VISIT NOTE  Subjective:  Tricia Walter is a 29 y.o. G3P1011 at [redacted]w[redacted]d being seen today for ongoing prenatal care.  She is currently monitored for the following issues for this high-risk pregnancy and has Supervision of high risk pregnancy, antepartum; History of cesarean delivery; Placenta previa; Subchorionic hematoma in second trimester; Vaginal bleeding before [redacted] weeks gestation; Anemia in pregnancy; and Anhydramnios in second trimester on their problem list.  Patient reports no complaints.  Contractions: Not present. Vag. Bleeding: Scant.  Movement: Present. Denies leaking of fluid.   Patient presents today to sign papers for termination of pregnancy.  Patient seen by MFM on yesterday.  She states that she understands the risk of poor outcome with expectant management.  She desires to move forward with termination of pregnancy.  Ultrasound on yesterday confirms anhydramnios and resolution of placenta previa.  The cervix was also noted to be shortened.    The following portions of the patient's history were reviewed and updated as appropriate: allergies, current medications, past family history, past medical history, past social history, past surgical history and problem list.   Objective:   Vitals:   07/13/20 1554  BP: 108/65  Pulse: 73  Weight: 165 lb 1.6 oz (74.9 kg)    Fetal Status: Fetal Heart Rate (bpm): 155   Movement: Present     General:  Alert, oriented and cooperative. Patient is in no acute distress.  Skin: Skin is warm and dry. No rash noted.   Cardiovascular: Normal heart rate noted  Respiratory: Normal respiratory effort, no problems with respiration noted  Abdomen: Soft, gravid, appropriate for gestational age.  Pain/Pressure: Absent     Pelvic: Cervical exam deferred        Extremities: Normal range of motion.  Edema: None  Mental Status: Normal mood and affect. Normal behavior. Normal judgment and thought content.   Assessment and Plan:  Pregnancy:  G3P1011 at [redacted]w[redacted]d 1. Anhydramnios in second trimester, fetus 1 of multiple gestation - Patient elects to terminate pregnancy.  Consent signed.    2. Supervision of high risk pregnancy, antepartum   No follow-ups on file.  Future Appointments  Date Time Provider Department Center  07/15/2020  8:30 AM Marny Lowenstein, PA-C [facility] None    Johnny [place], MD

## 2020-07-13 NOTE — Progress Notes (Signed)
Pt presents to sign IOL form d/t poor prognosis  Dx: Oligohydramnios

## 2020-07-14 ENCOUNTER — Telehealth: Payer: Self-pay | Admitting: Obstetrics & Gynecology

## 2020-07-14 ENCOUNTER — Other Ambulatory Visit: Payer: Self-pay | Admitting: Obstetrics & Gynecology

## 2020-07-14 ENCOUNTER — Other Ambulatory Visit: Payer: Self-pay | Admitting: Advanced Practice Midwife

## 2020-07-14 NOTE — Telephone Encounter (Signed)
     Faculty Practice OB/GYN Physician Phone Call Documentation  I placed a call to Tricia Walter about scheduling of her induction of labor for anhydramnios. She was seen in [facility] on 07/13/20 where she received counseling and signed the "Information Prior to Termination of Pregnancy - Written Certification" at 1630.  Offered induction date of 07/19/2021, but she declined this due to work constraints.  Desires 07/23/20.  This was scheduled, she will be [redacted]w[redacted]d, still below the limit of viability at this institution. She was told to expect a call from [facility] L&D with further instructions about her pre-admission COVID screening and any further instructions about the induction of labor. She was will be called that morning between 6:30am - 10 am to come in for her induction as soon as the room and staff are ready for her. In the meantime, preterm labor, infection and bleeding precautions reviewed with patient.  Was told to call office or come to [facility] for evaluation for any concerning symptoms.   Of note, the providers on 07/23/20 and 07/24/20 were notified about this admission. Orders placed.    Jaynie Collins, MD, FACOG Obstetrician & Gynecologist, [employer] for [employer], [facility]

## 2020-07-15 ENCOUNTER — Encounter: Payer: Medicaid Other | Admitting: Medical

## 2020-07-16 ENCOUNTER — Other Ambulatory Visit: Payer: Self-pay | Admitting: Advanced Practice Midwife

## 2020-07-16 ENCOUNTER — Telehealth (HOSPITAL_COMMUNITY): Payer: Self-pay | Admitting: *Deleted

## 2020-07-16 NOTE — Telephone Encounter (Signed)
Preadmission screen  

## 2020-07-17 ENCOUNTER — Other Ambulatory Visit (HOSPITAL_COMMUNITY): Payer: Medicaid Other

## 2020-07-19 ENCOUNTER — Encounter (HOSPITAL_COMMUNITY): Payer: Medicaid Other

## 2020-07-21 ENCOUNTER — Telehealth: Payer: Self-pay | Admitting: Lactation Services

## 2020-07-21 ENCOUNTER — Other Ambulatory Visit (HOSPITAL_COMMUNITY): Payer: Medicaid Other

## 2020-07-21 NOTE — Telephone Encounter (Signed)
-----   Message from Warden Fillers, MD sent at 07/21/2020 11:14 AM EDT ----- Regarding: FW: Rx /Appt Any ideas to slow down/stop lactation.  All I know is chilled cabbage and no nipple stimulation.  Thanks Dr. Donavan Foil ----- Message ----- From: Kennon Portela, CMA Sent: 07/21/2020  11:10 AM EDT To: Warden Fillers, MD Subject: Rx /Appt                                       See message below directly from front desk . Pt will be seeing you tomorrow.  Thanks   pt called about breast engorgement. pt had iufd at 21 wks del at [facility] and they told her to follow with [place] to see if she could get an prescription to stop the lacation process. I put her on the schedule for tomorrow bur I told her I would send you a message and maybe you could check and see with an doctor.

## 2020-07-21 NOTE — Telephone Encounter (Addendum)
Called patient at request of Dr. Donavan Foil and she reports she is engorged. She lost her infant this weekend at [redacted] weeks gestation.    She is engorged. She is currently using Cabbage leaves in her bra.   Reviewed options of:  Cabbage leaves often, switching out when they wilt Sudafed 12 hour and take every 12 hours for a few days Sage Tea Peppermint Oil to breasts Minimal breast stimulation  Can express a little for comfort as needed but not empty the breast Motrin as needed for pain or inflammation  Reviewed with patient that it will take a few days for her engorgement to go away.   Patient voiced understanding. Patient to call back with any questions or concerns. Reviewed breasts may be painful and warm during engorgement phase.   She has an appointment at [facility] tomorrow although most likely does not need as can be managed at home.

## 2020-07-22 ENCOUNTER — Ambulatory Visit: Payer: Medicaid Other | Admitting: Obstetrics and Gynecology

## 2020-07-23 ENCOUNTER — Inpatient Hospital Stay (HOSPITAL_COMMUNITY)
Admission: AD | Admit: 2020-07-23 | Payer: Medicaid Other | Source: Home / Self Care | Admitting: Obstetrics and Gynecology

## 2020-07-23 ENCOUNTER — Inpatient Hospital Stay (HOSPITAL_COMMUNITY): Payer: Medicaid Other

## 2020-09-13 ENCOUNTER — Other Ambulatory Visit: Payer: Medicaid Other

## 2020-09-13 DIAGNOSIS — Z20822 Contact with and (suspected) exposure to covid-19: Secondary | ICD-10-CM

## 2020-09-14 LAB — NOVEL CORONAVIRUS, NAA: SARS-CoV-2, NAA: NOT DETECTED

## 2020-09-14 LAB — SARS-COV-2, NAA 2 DAY TAT

## 2020-09-22 ENCOUNTER — Other Ambulatory Visit: Payer: Self-pay

## 2020-09-22 ENCOUNTER — Ambulatory Visit
Admission: EM | Admit: 2020-09-22 | Discharge: 2020-09-22 | Discharge status: Against Medical Advice | Payer: Medicaid Other

## 2020-09-22 NOTE — ED Notes (Signed)
Pt called x 2 did answer but never showed up into waiting area

## 2020-09-25 ENCOUNTER — Encounter (HOSPITAL_BASED_OUTPATIENT_CLINIC_OR_DEPARTMENT_OTHER): Payer: Self-pay | Admitting: *Deleted

## 2020-09-25 ENCOUNTER — Emergency Department (HOSPITAL_BASED_OUTPATIENT_CLINIC_OR_DEPARTMENT_OTHER)
Admission: EM | Admit: 2020-09-25 | Discharge: 2020-09-25 | Disposition: A | Discharge status: Home / Self Care | Payer: Medicaid Other | Attending: Emergency Medicine | Admitting: Emergency Medicine

## 2020-09-25 ENCOUNTER — Other Ambulatory Visit: Payer: Self-pay

## 2020-09-25 ENCOUNTER — Emergency Department (HOSPITAL_BASED_OUTPATIENT_CLINIC_OR_DEPARTMENT_OTHER): Payer: Medicaid Other

## 2020-09-25 DIAGNOSIS — R059 Cough, unspecified: Secondary | ICD-10-CM | POA: Diagnosis present

## 2020-09-25 HISTORY — DX: Gastro-esophageal reflux disease without esophagitis: K21.9

## 2020-09-25 MED ORDER — ALBUTEROL SULFATE HFA 108 (90 BASE) MCG/ACT IN AERS
2.0000 | INHALATION_SPRAY | Freq: Once | RESPIRATORY_TRACT | Status: AC
  Administered 2020-09-25: 2 via RESPIRATORY_TRACT
  Filled 2020-09-25: qty 6.7

## 2020-09-25 MED ORDER — PREDNISONE 20 MG PO TABS: 40.0000 mg | ORAL_TABLET | Freq: Every day | ORAL | 0 refills | Status: AC

## 2020-09-25 NOTE — Discharge Instructions (Addendum)
You were evaluated in the Emergency Department and after careful evaluation, we did not find any emergent condition requiring admission or further testing in the hospital.  Your exam/testing today was overall reassuring.  Your EKG and chest x-ray were normal.  Suspect you have some mild continued inflammation in your lungs causing your cough.  We recommend taking the prednisone prescription as directed and following up with your regular doctor.  Please return to the Emergency Department if you experience any worsening of your condition.  Thank you for allowing [place] to be a part of your care.

## 2020-09-25 NOTE — ED Provider Notes (Signed)
[facility] Emergency Department Provider Note MRN:  [id number]  Arrival date & time: 09/25/20     Chief Complaint   Cough   History of Present Illness   Tricia Walter is a 30 y.o. year-old female with a history of GERD presenting to the ED with chief complaint of cough.  5 weeks of persistent dry cough.  Thought maybe she had a virus in early December but the cough has not gone away.  Over the past 2 or 3 days feeling dyspneic on exertion.  Feeling some tightness in the chest.  Denies headache or vision change, no sore throat, no nasal congestion, no abdominal pain, no dysuria, no numbness or weakness to the arms or legs.  Symptoms are mild, constant, no other exacerbating or alleviating factors.  Tested negative for Covid 2 days ago.  Review of Systems  A complete 10 system review of systems was obtained and all systems are negative except as noted in the HPI and PMH.   Patient's Health History    Past Medical History:  Diagnosis Date  . Anemia   . GERD (gastroesophageal reflux disease)   . Infection    UTI  . Migraine     Past Surgical History:  Procedure Laterality Date  . CESAREAN SECTION      Family History  Problem Relation Age of Onset  . Colon cancer Mother   . COPD Mother   . Hypertension Mother   . Lung cancer Father     Social History   Socioeconomic History  . Marital status: Single    Spouse name: Not on file  . Number of children: Not on file  . Years of education: Not on file  . Highest education level: Not on file  Occupational History  . Not on file  Tobacco Use  . Smoking status: Never Smoker  . Smokeless tobacco: Never Used  Vaping Use  . Vaping Use: Never used  Substance and Sexual Activity  . Alcohol use: Not Currently    Comment: occ  . Drug use: No  . Sexual activity: Not Currently  Other Topics Concern  . Not on file  Social History Narrative  . Not on file   Social Determinants of Health    Financial Resource Strain: Not on file  Food Insecurity: Not on file  Transportation Needs: Not on file  Physical Activity: Not on file  Stress: Not on file  Social Connections: Not on file  Intimate Partner Violence: Not on file     Physical Exam   Vitals:   09/25/20 0020 09/25/20 0122  BP: 120/80   Pulse: 78   Resp: 17   Temp: 98.7 F (37.1 C)   SpO2: 100% 99%    CONSTITUTIONAL: Well-appearing, NAD NEURO:  Alert and oriented x 3, no focal deficits EYES:  eyes equal and reactive ENT/NECK:  no LAD, no JVD CARDIO: Regular rate, well-perfused, normal S1 and S2 PULM:  CTAB no wheezing or rhonchi GI/GU:  normal bowel sounds, non-distended, non-tender MSK/SPINE:  No gross deformities, no edema SKIN:  no rash, atraumatic PSYCH:  Appropriate speech and behavior  *Additional and/or pertinent findings included in MDM below  Diagnostic and Interventional Summary    EKG Interpretation  Date/Time:  Saturday September 25 2020 01:17:51 EST Ventricular Rate:  75 PR Interval:    QRS Duration: 85 QT Interval:  386 QTC Calculation: 432 R Axis:   53 Text Interpretation: Sinus rhythm Confirmed by Kennis Carina [phone]) on 09/25/2020  1:21:48 AM      Labs Reviewed - No data to display  DG Chest Port 1 View  Final Result      Medications  albuterol (VENTOLIN HFA) 108 (90 Base) MCG/ACT inhaler 2 puff (2 puffs Inhalation Given 09/25/20 0122)     Procedures  /  Critical Care Procedures  ED Course and Medical Decision Making  I have reviewed the triage vital signs, the nursing notes, and pertinent available records from the EMR.  Listed above are laboratory and imaging tests that I personally ordered, reviewed, and interpreted and then considered in my medical decision making (see below for details).  Suspect viral illness with lingering pulmonary inflammation, no evidence of DVT on exam, no tachycardia, no hypoxia, no increased work of breathing, PERC negative, doubt PE.  Given  the duration of cough will screen with chest x-ray, given the chest discomfort will screen with EKG.  Overall doubt emergent process, would consider prednisone burst to try to alleviate symptoms.       Elmer Sow. Pilar Plate, MD [facility] Emergency Medicine [facility] [email or web address]  Final Clinical Impressions(s) / ED Diagnoses     ICD-10-CM   1. Cough  R05.9     ED Discharge Orders         Ordered    predniSONE (DELTASONE) 20 MG tablet  Daily        09/25/20 0140           Discharge Instructions Discussed with and Provided to Patient:     Discharge Instructions     You were evaluated in the Emergency Department and after careful evaluation, we did not find any emergent condition requiring admission or further testing in the hospital.  Your exam/testing today was overall reassuring.  Your EKG and chest x-ray were normal.  Suspect you have some mild continued inflammation in your lungs causing your cough.  We recommend taking the prednisone prescription as directed and following up with your regular doctor.  Please return to the Emergency Department if you experience any worsening of your condition.  Thank you for allowing [place] to be a part of your care.        Sabas Sous, MD 09/25/20 [phone]

## 2020-09-25 NOTE — ED Triage Notes (Signed)
Pt. Reports a nagging cough for several weeks now and getting worse with R sided chest tightness and shortness of breath off and on that started 3 days ago.  No nasal congestion .  No other aches or pains per Pt.

## 2020-10-15 IMAGING — US US OB LIMITED
1 series · 14 of 20 positions shown · non-contrast
Comparison: none

CLINICAL DATA: Pelvic pain, bleeding

EXAM:
LIMITED OBSTETRIC ULTRASOUND

[Series 1: us ob limited · 20 acquisitions, 14 frames shown]
[im 1/20]
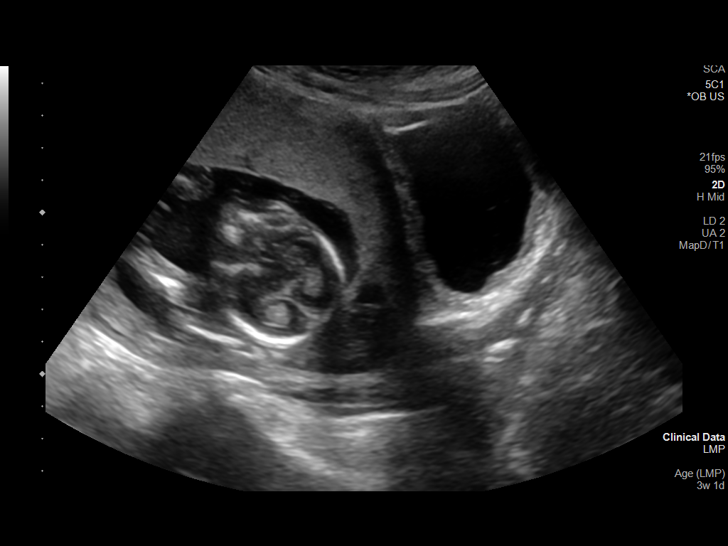
[im 3/20]
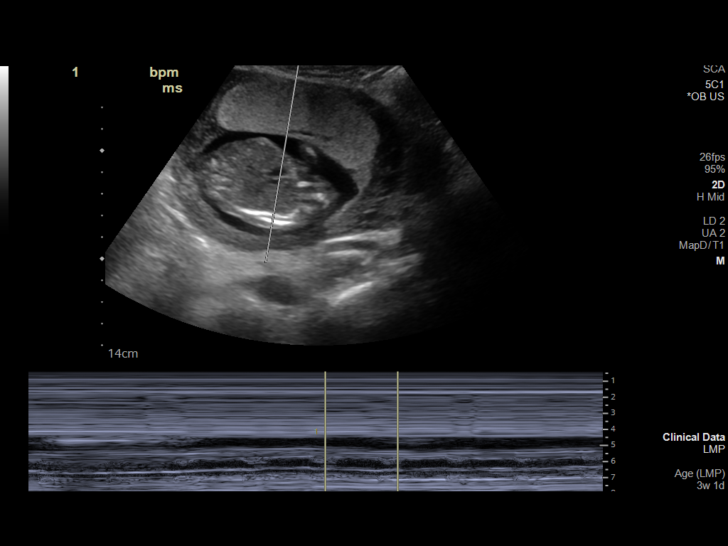
[im 4/20]
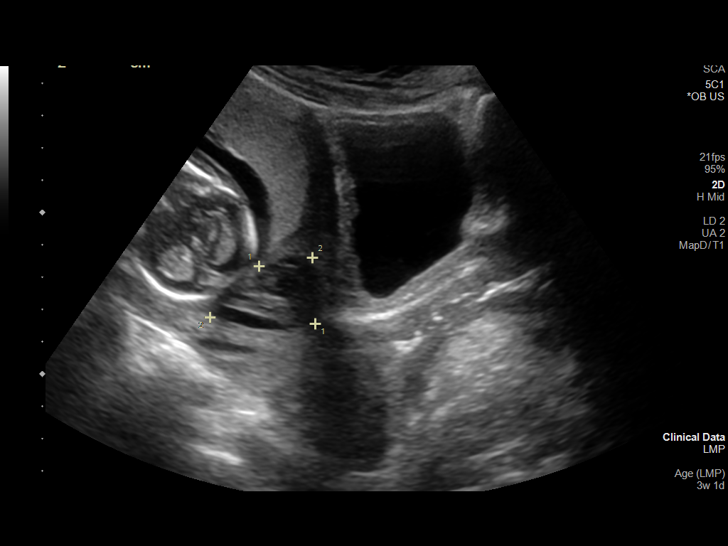
[im 6/20]
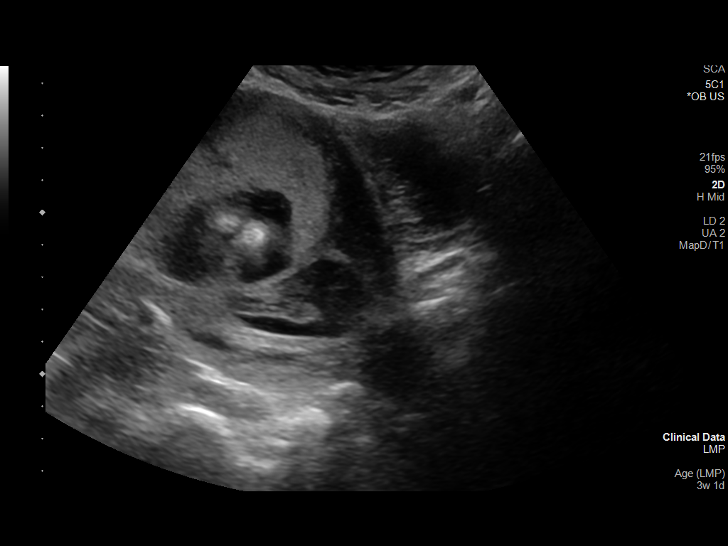
[im 7/20]
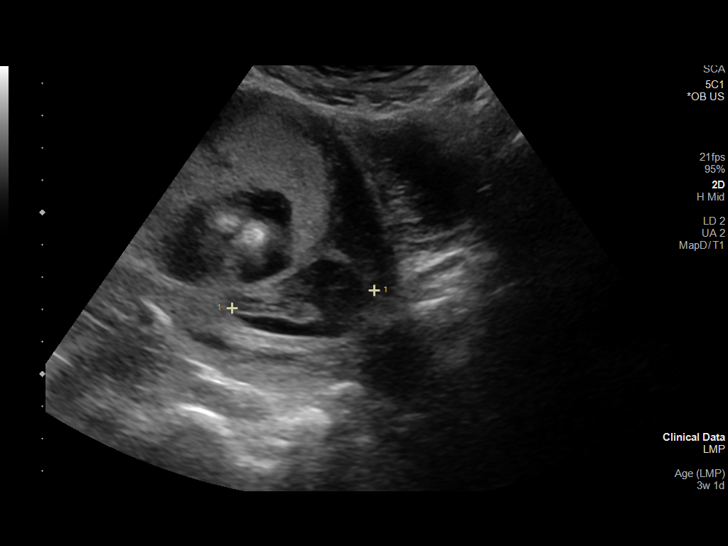
[im 8/20]
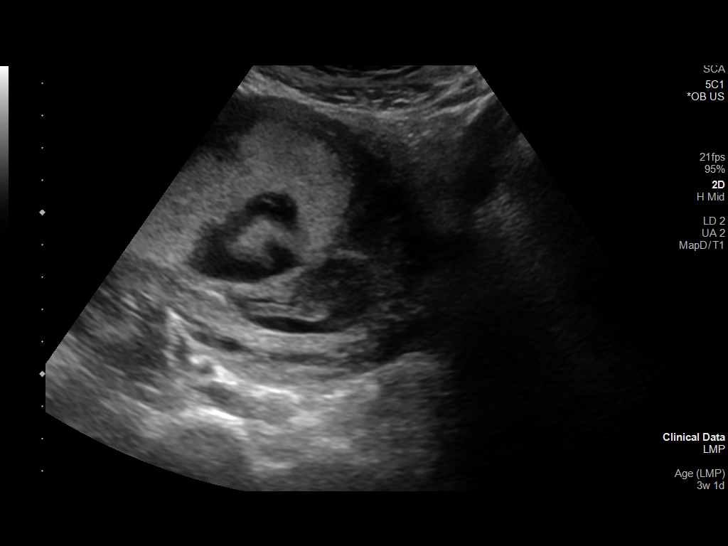
[im 10/20]
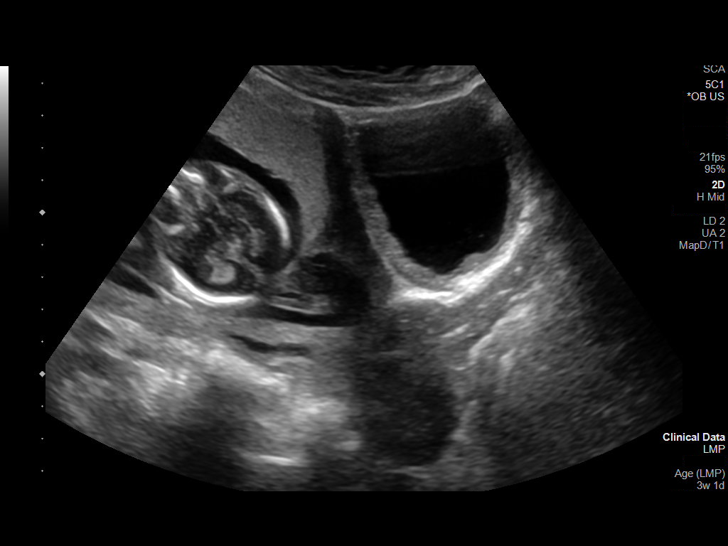
[im 11/20]
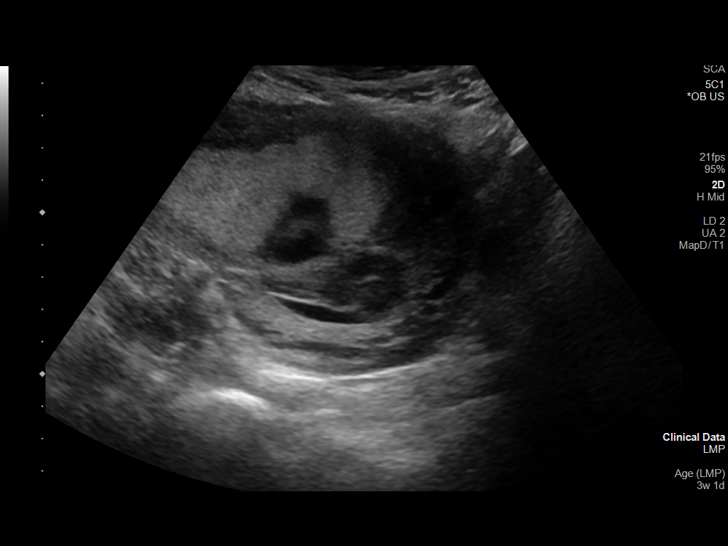
[im 13/20]
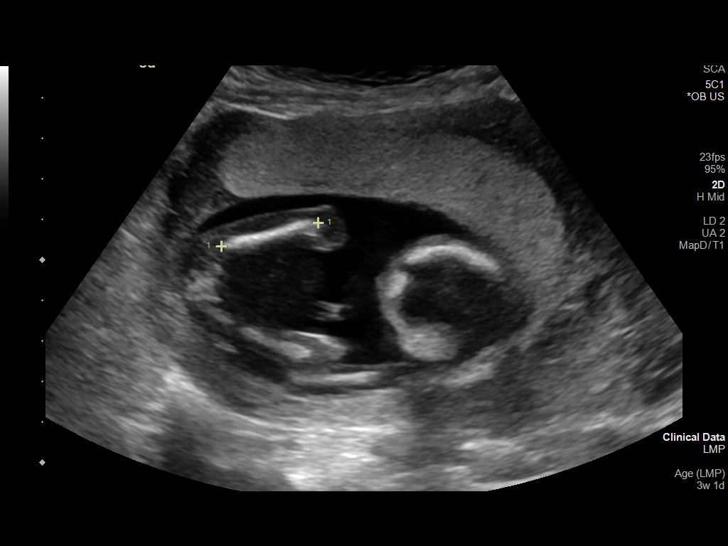
[im 14/20]
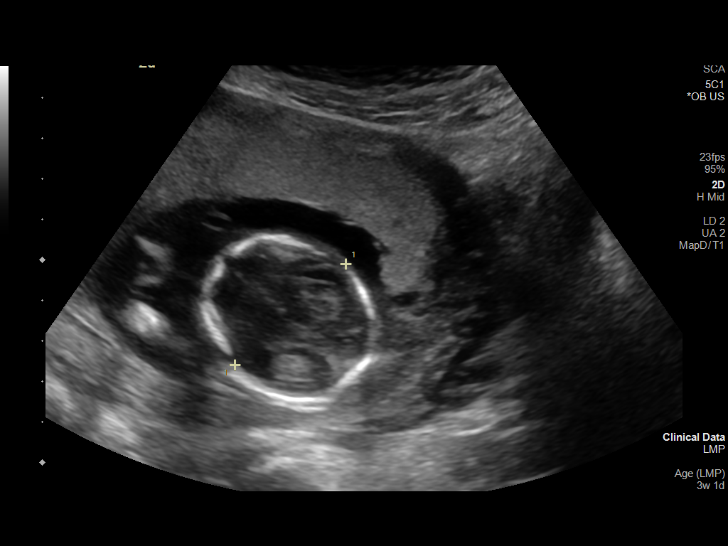
[im 16/20]
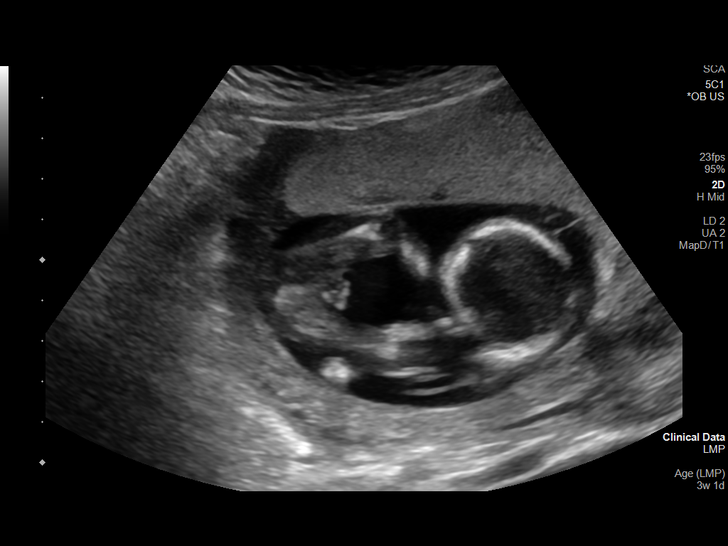
[im 17/20]
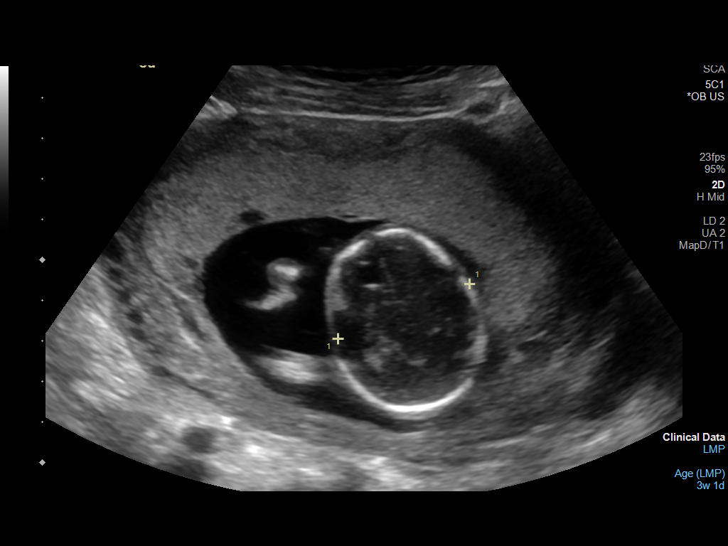
[im 18/20]
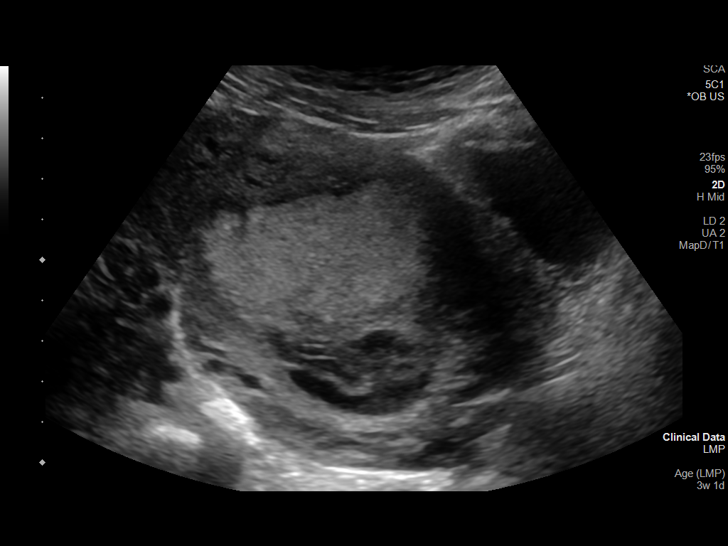
[im 20/20]
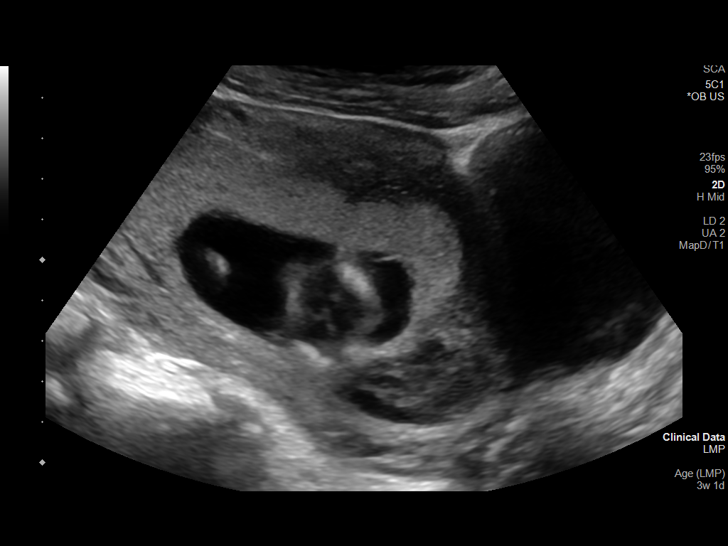

[14 of 20 positions shown; findings below may reference images not displayed]

FINDINGS: Number of Fetuses: 1

Heart Rate:  147 bpm

Movement: Yes

Presentation: Cephalic

Placental Location: Fundal

Previa: Marginal. There is a 2.5 by 3.7 x 4.4 cm area of hemorrhage
interposed between the placental margin and the internal cervical
os.

Amniotic Fluid (Subjective):  Normal

BPD: 3.5 cm 16 w  6 d

MATERNAL FINDINGS:

Cervix:  Appears closed.

Uterus/Adnexae: No abnormality visualized.
IMPRESSION: 1. Single live intrauterine pregnancy as above, estimated age 16
weeks and 6 days.
2. Low lying placenta, marginal for placenta previa. There is a
x 3.7 x 4.4 cm subchorionic hemorrhage interposed within the
inferior placental margin in the internal cervical os.

This exam is performed on an emergent basis and does not
comprehensively evaluate fetal size, dating, or anatomy; follow-up
complete OB US should be considered if further fetal assessment is
warranted.

## 2020-12-17 ENCOUNTER — Emergency Department (HOSPITAL_BASED_OUTPATIENT_CLINIC_OR_DEPARTMENT_OTHER)
Admission: EM | Admit: 2020-12-17 | Discharge: 2020-12-18 | Disposition: A | Discharge status: Home / Self Care | Payer: Medicaid Other | Attending: Emergency Medicine | Admitting: Emergency Medicine

## 2020-12-17 ENCOUNTER — Other Ambulatory Visit: Payer: Self-pay

## 2020-12-17 ENCOUNTER — Encounter (HOSPITAL_BASED_OUTPATIENT_CLINIC_OR_DEPARTMENT_OTHER): Payer: Self-pay | Admitting: Emergency Medicine

## 2020-12-17 DIAGNOSIS — Y92002 Bathroom of unspecified non-institutional (private) residence single-family (private) house as the place of occurrence of the external cause: Secondary | ICD-10-CM | POA: Diagnosis not present

## 2020-12-17 DIAGNOSIS — S022XXB Fracture of nasal bones, initial encounter for open fracture: Secondary | ICD-10-CM | POA: Insufficient documentation

## 2020-12-17 DIAGNOSIS — S0992XA Unspecified injury of nose, initial encounter: Secondary | ICD-10-CM | POA: Diagnosis present

## 2020-12-17 DIAGNOSIS — W500XXA Accidental hit or strike by another person, initial encounter: Secondary | ICD-10-CM | POA: Insufficient documentation

## 2020-12-17 DIAGNOSIS — S0121XA Laceration without foreign body of nose, initial encounter: Secondary | ICD-10-CM | POA: Diagnosis not present
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
## 2020-12-18 MED ORDER — AMOXICILLIN-POT CLAVULANATE 875-125 MG PO TABS: 1.0000 | ORAL_TABLET | Freq: Two times a day (BID) | ORAL | 0 refills | Status: DC

## 2020-12-18 MED ORDER — AMOXICILLIN-POT CLAVULANATE 875-125 MG PO TABS
1.0000 | ORAL_TABLET | Freq: Once | ORAL | Status: AC
  Administered 2020-12-18: 1 via ORAL
  Filled 2020-12-18: qty 1

## 2020-12-18 MED ORDER — LIDOCAINE-EPINEPHRINE (PF) 2 %-1:200000 IJ SOLN
10.0000 mL | Freq: Once | INTRAMUSCULAR | Status: AC
  Administered 2020-12-18: 10 mL via INTRADERMAL
  Filled 2020-12-18: qty 20

## 2020-12-18 NOTE — Discharge Instructions (Signed)
Your nose is likely broken.  If you would like you can follow-up with an ear nose and throat doctor who can evaluate this, usually the like to see you in about a week.  Return for redness drainage or if you get a fever.  The sutures that were used are dissolvable that should dissolve between day 3 and day 5.  If they are still there then you can gently plucked them out with tweezers.  The area can get wet but not fully immersed underwater.  No scrubbing.  If you really want to clean it you can apply a half-and-half hydrogen peroxide solution with water on a Q-tip.  You can apply an ointment a couple times a day this could be as simple as Vaseline but could also be an antibiotic ointment if you wish..  Once it is healed please try to avoid prolonged sun exposure use sunscreen.  Gells that have silicone antigens have been shown to reduce scarring and some research.

## 2020-12-18 NOTE — ED Notes (Signed)
Swelling and small laceration noted to bridge of nose.
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
## 2020-12-18 NOTE — ED Notes (Signed)
Dressing applied to nose.

## 2021-03-08 ENCOUNTER — Encounter (HOSPITAL_BASED_OUTPATIENT_CLINIC_OR_DEPARTMENT_OTHER): Payer: Self-pay | Admitting: Emergency Medicine

## 2021-03-08 ENCOUNTER — Emergency Department (HOSPITAL_BASED_OUTPATIENT_CLINIC_OR_DEPARTMENT_OTHER): Payer: Medicaid Other

## 2021-03-08 ENCOUNTER — Other Ambulatory Visit: Payer: Self-pay

## 2021-03-08 ENCOUNTER — Emergency Department (HOSPITAL_BASED_OUTPATIENT_CLINIC_OR_DEPARTMENT_OTHER)
Admission: EM | Admit: 2021-03-08 | Discharge: 2021-03-08 | Disposition: A | Discharge status: Home / Self Care | Payer: Medicaid Other | Attending: Emergency Medicine | Admitting: Emergency Medicine

## 2021-03-08 DIAGNOSIS — K219 Gastro-esophageal reflux disease without esophagitis: Secondary | ICD-10-CM | POA: Diagnosis not present

## 2021-03-08 DIAGNOSIS — N939 Abnormal uterine and vaginal bleeding, unspecified: Secondary | ICD-10-CM | POA: Diagnosis not present

## 2021-03-08 DIAGNOSIS — B9689 Other specified bacterial agents as the cause of diseases classified elsewhere: Secondary | ICD-10-CM | POA: Insufficient documentation

## 2021-03-08 DIAGNOSIS — R102 Pelvic and perineal pain: Secondary | ICD-10-CM

## 2021-03-08 DIAGNOSIS — N760 Acute vaginitis: Secondary | ICD-10-CM

## 2021-03-08 LAB — CBC WITH DIFFERENTIAL/PLATELET
Abs Immature Granulocytes: 0 10*3/uL (ref 0.00–0.07)
Basophils Absolute: 0 10*3/uL (ref 0.0–0.1)
Basophils Relative: 0 %
Eosinophils Absolute: 0.1 10*3/uL (ref 0.0–0.5)
Eosinophils Relative: 1 %
HCT: 40 % (ref 36.0–46.0)
Hemoglobin: 13.3 g/dL (ref 12.0–15.0)
Immature Granulocytes: 0 %
Lymphocytes Relative: 48 %
Lymphs Abs: 2.3 10*3/uL (ref 0.7–4.0)
MCH: 29.9 pg (ref 26.0–34.0)
MCHC: 33.3 g/dL (ref 30.0–36.0)
MCV: 89.9 fL (ref 80.0–100.0)
Monocytes Absolute: 0.3 10*3/uL (ref 0.1–1.0)
Monocytes Relative: 7 %
Neutro Abs: 2.1 10*3/uL (ref 1.7–7.7)
Neutrophils Relative %: 44 %
Platelets: 231 10*3/uL (ref 150–400)
RBC: 4.45 MIL/uL (ref 3.87–5.11)
RDW: 13.5 % (ref 11.5–15.5)
WBC: 4.7 10*3/uL (ref 4.0–10.5)
nRBC: 0 % (ref 0.0–0.2)

## 2021-03-08 LAB — HIV ANTIBODY (ROUTINE TESTING W REFLEX): HIV Screen 4th Generation wRfx: NONREACTIVE

## 2021-03-08 LAB — BASIC METABOLIC PANEL
Anion gap: 4 — ABNORMAL LOW (ref 5–15)
BUN: 10 mg/dL (ref 6–20)
CO2: 27 mmol/L (ref 22–32)
Calcium: 8.8 mg/dL — ABNORMAL LOW (ref 8.9–10.3)
Chloride: 107 mmol/L (ref 98–111)
Creatinine, Ser: 0.8 mg/dL (ref 0.44–1.00)
GFR, Estimated: >60 mL/min (ref >60)
Glucose, Bld: 85 mg/dL (ref 70–99)
Potassium: 4 mmol/L (ref 3.5–5.1)
Sodium: 138 mmol/L (ref 135–145)

## 2021-03-08 LAB — URINALYSIS, ROUTINE W REFLEX MICROSCOPIC
Bilirubin Urine: NEGATIVE
Glucose, UA: NEGATIVE mg/dL
Ketones, ur: NEGATIVE mg/dL
Leukocytes,Ua: NEGATIVE
Nitrite: NEGATIVE
Protein, ur: NEGATIVE mg/dL
Specific Gravity, Urine: 1.02 (ref 1.005–1.030)
pH: 7.5 (ref 5.0–8.0)

## 2021-03-08 LAB — WET PREP, GENITAL
Sperm: NONE SEEN
Trich, Wet Prep: NONE SEEN
Yeast Wet Prep HPF POC: NONE SEEN

## 2021-03-08 LAB — URINALYSIS, MICROSCOPIC (REFLEX)

## 2021-03-08 LAB — PREGNANCY, URINE: Preg Test, Ur: NEGATIVE

## 2021-03-08 MED ORDER — METRONIDAZOLE 500 MG PO TABS: 500.0000 mg | ORAL_TABLET | Freq: Two times a day (BID) | ORAL | 0 refills | Status: DC

## 2021-03-08 MED ORDER — KETOROLAC TROMETHAMINE 30 MG/ML IJ SOLN
30.0000 mg | Freq: Once | INTRAMUSCULAR | Status: AC
  Administered 2021-03-08: 30 mg via INTRAVENOUS
  Filled 2021-03-08: qty 1

## 2021-03-08 NOTE — ED Triage Notes (Signed)
Reports lower abdominal cramping that started yesterday when she started her cycle for the third time this month.  Using ibuprofen for the pain.  Last dose 1 am.  Hx of pregnancy with hemorrhage.  Had miscarriage at 23 weeks.  Not currently on birth control and reports not having sex.

## 2021-03-08 NOTE — ED Provider Notes (Signed)
[facility]PARTMENT Provider Note   CSN: [id number] Arrival date & time: 03/08/21  0272     History Chief Complaint  Patient presents with   Abdominal Cramping    Tricia Walter is a 30 y.o. female with no significant past medical history who presents for evaluation of vaginal bleeding.  States this began yesterday.  She has had 2 menstrual cycles already this month.  States yesterday began with some light spotting.  Was seen by her PCP.  Had negative pregnancy test and negative urine test. Scheduled for [place] outpatient.  Was told to follow-up with [facility] however she cannot be seen till the middle of July.  She has lower abdominal cramping which is consistent with her prior menstrual cycles were feels like this is worse than normal.  No focal pain.  No lightheadedness or dizziness.  States she also does have a possible abnormal vaginal discharge.  No concerns for any STDs.  No fever, chills, nausea, vomiting, dysuria, hematuria, diarrhea, constipation. No current BC. Denies additional aggravating or alleviating factors.  Changes pads every 2-3 hours however patient states these are not soaked through.  She changes more so to be "clean."  History obtained from patient and past medical records.  No interpreter used.  HPI     Past Medical History:  Diagnosis Date   Anemia    GERD (gastroesophageal reflux disease)    Infection    UTI   Migraine     Patient Active Problem List   Diagnosis Date Noted   Anhydramnios in second trimester 07/06/2020   Anemia in pregnancy 06/19/2020   History of cesarean delivery 06/18/2020   Placenta previa 06/18/2020   Subchorionic hematoma in second trimester 06/18/2020   Vaginal bleeding before [redacted] weeks gestation 06/18/2020   Supervision of high risk pregnancy, antepartum 06/17/2020    Past Surgical History:  Procedure Laterality Date   CESAREAN SECTION       OB History     Gravida  3   Para  1   Term  1    Preterm      AB  1   Living  1      SAB      IAB  1   Ectopic      Multiple      Live Births  1           Family History  Problem Relation Age of Onset   Colon cancer Mother    COPD Mother    Hypertension Mother    Lung cancer Father     Social History   Tobacco Use   Smoking status: Never   Smokeless tobacco: Never  Vaping Use   Vaping Use: Never used  Substance Use Topics   Alcohol use: Not Currently    Comment: occ   Drug use: No    Home Medications Prior to Admission medications   Medication Sig Start Date End Date Taking? Authorizing Provider  metroNIDAZOLE (FLAGYL) 500 MG tablet Take 1 tablet (500 mg total) by mouth 2 (two) times daily. 03/08/21  Yes Kriston Mckinnie A, PA-C  acetaminophen (TYLENOL) 500 MG tablet Take 1 tablet (500 mg total) by mouth every 6 (six) hours as needed. Patient not taking: Reported on 07/13/2020 07/03/19   Rodell Perna A, PA-C  amoxicillin-clavulanate (AUGMENTIN) 875-125 MG tablet Take 1 tablet by mouth 2 (two) times daily. One po bid x 7 days 12/18/20   Deno Etienne, DO  Blood Pressure Monitoring (  BLOOD PRESSURE KIT) DEVI 1 kit by Does not apply route as needed. Patient not taking: Reported on 07/13/2020 06/17/20   Woodroe Mode, MD  nitrofurantoin, macrocrystal-monohydrate, (MACROBID) 100 MG capsule Take 1 capsule (100 mg total) by mouth 2 (two) times daily. 06/29/20   Woodroe Mode, MD  Prenatal Vit-Fe Fumarate-FA (PRENATAL COMPLETE) 14-0.4 MG TABS Take 1 tablet by mouth daily. 06/19/20   Griffin Basil, MD    Allergies    Patient has no known allergies.  Review of Systems   Review of Systems  Constitutional: Negative.   HENT: Negative.    Respiratory: Negative.    Cardiovascular: Negative.   Gastrointestinal: Negative.   Genitourinary:  Positive for menstrual problem, pelvic pain and vaginal bleeding. Negative for difficulty urinating, dysuria, flank pain, frequency, genital sores, hematuria, vaginal discharge and  vaginal pain.  Musculoskeletal: Negative.   Skin: Negative.   Neurological: Negative.   All other systems reviewed and are negative.  Physical Exam Updated Vital Signs BP 110/77   Pulse 65   Temp 98.4 F (36.9 C) (Oral)   Resp 20   Ht $R'5\' 3"'qd$  (1.6 m)   Wt 88 kg   LMP 03/08/2021   SpO2 100%   BMI 34.37 kg/m   Physical Exam Vitals and nursing note reviewed.  Constitutional:      General: She is not in acute distress.    Appearance: She is well-developed. She is not ill-appearing, toxic-appearing or diaphoretic.  HENT:     Head: Atraumatic.     Nose: Nose normal.     Mouth/Throat:     Mouth: Mucous membranes are moist.  Eyes:     Pupils: Pupils are equal, round, and reactive to light.  Cardiovascular:     Rate and Rhythm: Normal rate.     Pulses: Normal pulses.     Heart sounds: Normal heart sounds.  Pulmonary:     Effort: Pulmonary effort is normal. No respiratory distress.     Breath sounds: Normal breath sounds.  Abdominal:     General: There is no distension.     Palpations: Abdomen is soft.     Tenderness: There is abdominal tenderness.     Comments: Diffuse tenderness to lower abdomen.  No focal pain.  Genitourinary:    Comments: Normal appearing external female genitalia without rashes or lesions, normal vaginal epithelium. Normal appearing cervix without discharge or petechiae. Cervical os is closed. There is mild bleeding noted at the os. Mild Odor. Bimanual: No CMT,  nontender.  No palpable adnexal masses or tenderness. Uterus midline and not fixed. Rectovaginal exam was deferred.  No cystocele or rectocele noted. No pelvic lymphadenopathy noted. Wet prep was obtained.  Cultures for gonorrhea and chlamydia collected. Exam performed with chaperone in room.   Musculoskeletal:        General: Normal range of motion.     Cervical back: Normal range of motion.  Skin:    General: Skin is warm and dry.     Capillary Refill: Capillary refill takes less than 2 seconds.   Neurological:     General: No focal deficit present.     Mental Status: She is alert and oriented to person, place, and time.  Psychiatric:        Mood and Affect: Mood normal.   ED Results / Procedures / Treatments   Labs (all labs ordered are listed, but only abnormal results are displayed) Labs Reviewed  WET PREP, GENITAL - Abnormal; Notable for the following  components:      Result Value   Clue Cells Wet Prep HPF POC PRESENT (*)    WBC, Wet Prep HPF POC FEW (*)    All other components within normal limits  BASIC METABOLIC PANEL - Abnormal; Notable for the following components:   Calcium 8.8 (*)    Anion gap 4 (*)    All other components within normal limits  URINALYSIS, ROUTINE W REFLEX MICROSCOPIC - Abnormal; Notable for the following components:   APPearance HAZY (*)    Hgb urine dipstick LARGE (*)    All other components within normal limits  URINALYSIS, MICROSCOPIC (REFLEX) - Abnormal; Notable for the following components:   Bacteria, UA FEW (*)    All other components within normal limits  CBC WITH DIFFERENTIAL/PLATELET  PREGNANCY, URINE  RPR  HIV ANTIBODY (ROUTINE TESTING W REFLEX)  GC/CHLAMYDIA PROBE AMP () NOT AT [facility]    EKG None  Radiology US PELVIC COMPLETE W TRANSVAGINAL AND TORSION R/O  Result Date: 03/08/2021 CLINICAL DATA:  Pelvic pain, LMP 03/06/2021 EXAM: TRANSABDOMINAL AND TRANSVAGINAL ULTRASOUND OF PELVIS DOPPLER ULTRASOUND OF OVARIES TECHNIQUE: Both transabdominal and transvaginal ultrasound examinations of the pelvis were performed. Transabdominal technique was performed for global imaging of the pelvis including uterus, ovaries, adnexal regions, and pelvic cul-de-sac. It was necessary to proceed with endovaginal exam following the transabdominal exam to visualize the endometrium and ovaries. Color and duplex Doppler ultrasound was utilized to evaluate blood flow to the ovaries. COMPARISON:  For none FINDINGS: Uterus Measurements: 7.9 x 3.6  x 4.7 cm = volume: 71 mL. Anteverted. Normal morphology without mass Endometrium Thickness: 3 mm.  No endometrial fluid or focal abnormality Right ovary Measurements: 4.2 x 2.0 x 2.6 cm = volume: 11.2 mL. Normal morphology without mass. Internal blood flow present on color Doppler imaging. Left ovary Measurements: 2.8 x 2.3 x 2.3 cm = volume: 9.2 mL. Normal morphology without mass. Internal blood flow present on color Doppler imaging. Pulsed Doppler evaluation of both ovaries demonstrates normal low-resistance arterial and venous waveforms. Other findings No free pelvic fluid.  No adnexal masses. IMPRESSION: Normal exam. Electronically Signed   By: Lavonia Dana M.D.   On: 03/08/2021 14:08    Procedures Procedures   Medications Ordered in ED Medications  ketorolac (TORADOL) 30 MG/ML injection 30 mg (30 mg Intravenous Given 03/08/21 1224)    ED Course  I have reviewed the triage vital signs and the nursing notes.  Pertinent labs & imaging results that were available during my care of the patient were reviewed by me and considered in my medical decision making (see chart for details).  Here for evaluation of vaginal bleeding and cramping.  She is on her third menstrual cycle this month.  Has not followed with OB/GYN.  States she only has spotting currently.  Diffuse lower abdominal cramping.  Also admits to new possible vaginal discharge.  No lightheadedness or dizziness.  We will plan on labs, imaging, pelvic exam and reassess  Labs and imaging personally reviewed and interpreted:  CBC without leukocytosis at 4.7, Hgb 13.3 improved from prior BMP no acute abnormality UA large blood, few bacteria low suspicion for UTI Wet prep with BV [place] wo acute abnormality   Patient reassessed.  Pain improved with Toradol.  I discussed labs and imaging as well as possible medications.  Patient does not want any medications at this time.  Discussed watchful waiting and close OB/GYN follow-up.  She prefers this  which I feel is reasonable  at this time.  Will place referral to [facility] to see if she can get in sooner than 1 month with the [facility] given she is not currently followed by anyone.  Patient is nontoxic, nonseptic appearing, in no apparent distress.  Patient's pain and other symptoms adequately managed in emergency department.  Fluid bolus given.  Labs, imaging and vitals reviewed.  Patient does not meet the SIRS or Sepsis criteria.  On repeat exam patient does not have a surgical abdomin and there are no peritoneal signs.  No indication of appendicitis, bowel obstruction, bowel perforation, cholecystitis, diverticulitis, TOA, torsion, PID or ectopic pregnancy.  Patient discharged home with symptomatic treatment and given strict instructions for follow-up with their primary care physician.  I have also discussed reasons to return immediately to the ER.  Patient expresses understanding and agrees with plan.      MDM Rules/Calculators/A&P                           Final Clinical Impression(s) / ED Diagnoses Final diagnoses:  Pelvic pain  BV (bacterial vaginosis)  Abnormal vaginal bleeding    Rx / DC Orders ED Discharge Orders          Ordered    metroNIDAZOLE (FLAGYL) 500 MG tablet  2 times daily        03/08/21 1435    Ambulatory referral to Obstetrics / Gynecology        03/08/21 1436             Dajane Valli A, PA-C 03/08/21 1438    Tegeler, Gwenyth Allegra, MD 03/08/21 1546

## 2021-03-08 NOTE — ED Notes (Signed)
Pt discharged to home. Discharge instructions have been discussed with patient and/or family members. Pt verbally acknowledges understanding d/c instructions, and endorses comprehension to checkout at registration before leaving.  °

## 2021-03-08 NOTE — ED Notes (Signed)
Now complains of abd cramping post pelvic exam, ED PA informed, orders rec

## 2021-03-08 NOTE — ED Notes (Signed)
Pt repositioned and warm blanket applied to aid in comfort measures

## 2021-03-08 NOTE — Discharge Instructions (Addendum)
Your wet prep did show some bacterial vaginosis.  This is not an STD.  This is just an overgrowth of the normal bacteria that lives in the vagina.  I have started on medication called Flagyl.  Do not drink alcohol with this medication and for 48 hours after taking this medication  I have placed a referral to her OB/GYN.  Return for new or worsening symptoms

## 2021-03-09 LAB — GC/CHLAMYDIA PROBE AMP (~~LOC~~) NOT AT ARMC
Chlamydia: NEGATIVE
Comment: NEGATIVE
Comment: NORMAL
Neisseria Gonorrhea: NEGATIVE

## 2021-03-09 LAB — RPR: RPR Ser Ql: NONREACTIVE

## 2021-07-13 IMAGING — US US PELVIS COMPLETE TRANSABD/TRANSVAG W DUPLEX
1 series · 13 of 25 positions shown · non-contrast
Comparison: For none

CLINICAL DATA: Pelvic pain, LMP 03/06/2021

EXAM:
TRANSABDOMINAL AND TRANSVAGINAL ULTRASOUND OF PELVIS
DOPPLER ULTRASOUND OF OVARIES
TECHNIQUE: Both transabdominal and transvaginal ultrasound examinations of the
pelvis were performed. Transabdominal technique was performed for
global imaging of the pelvis including uterus, ovaries, adnexal
regions, and pelvic cul-de-sac.
It was necessary to proceed with endovaginal exam following the
transabdominal exam to visualize the endometrium and ovaries. Color
and duplex Doppler ultrasound was utilized to evaluate blood flow to
the ovaries.

[Series 1: us pelvis complete transabd/transvag w duplex · 13 of 92 slices shown]
[im 1/92]
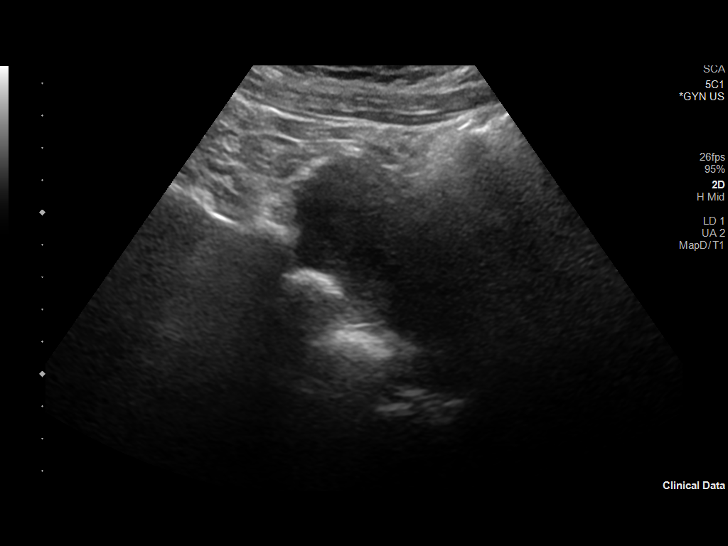
[im 8/92]
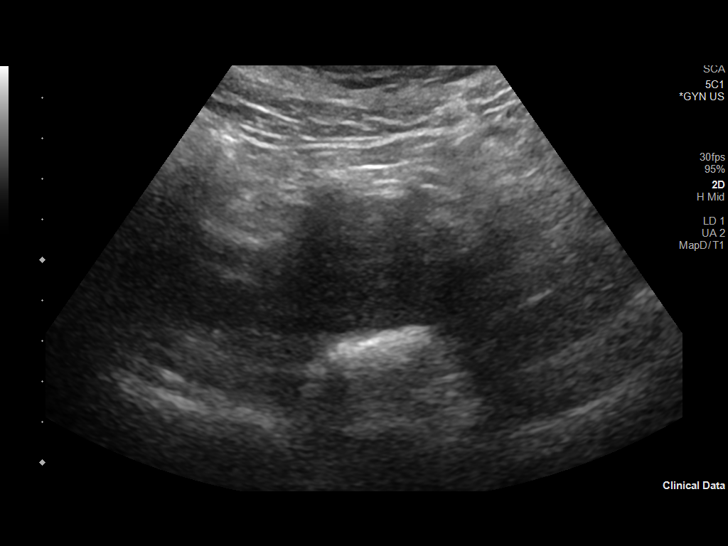
[im 16/92]
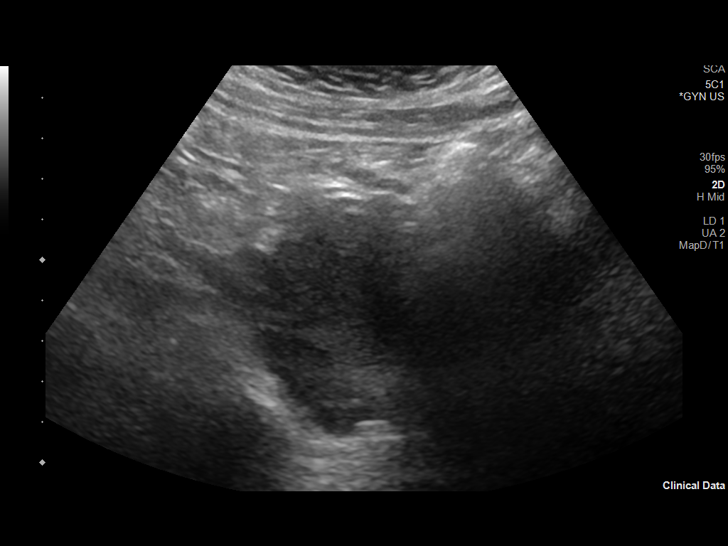
[im 23/92]
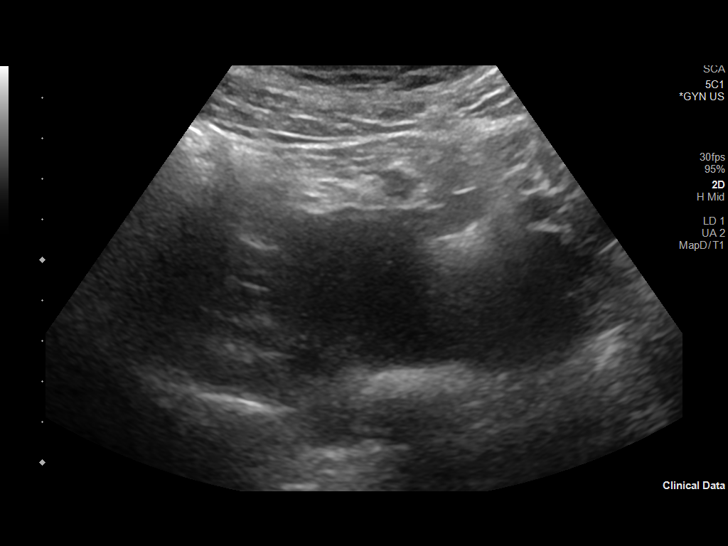
[im 31/92]
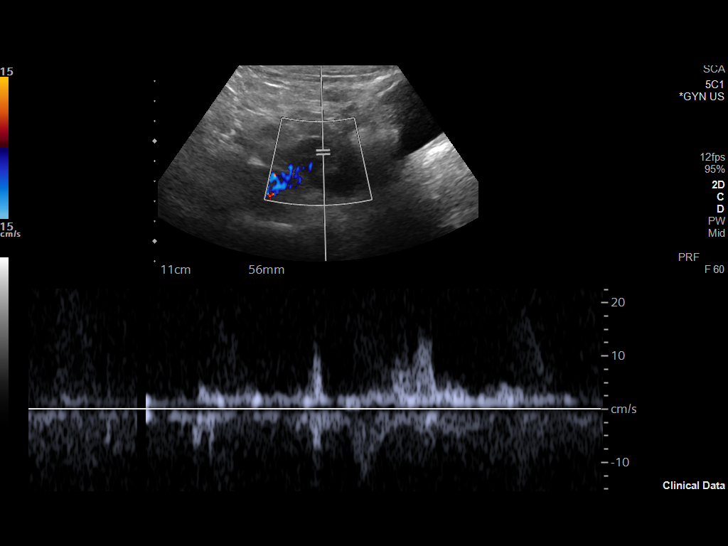
[im 38/92]
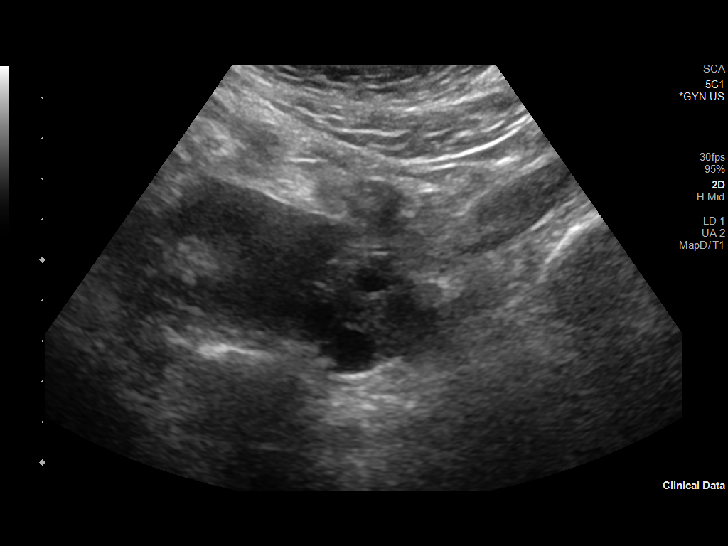
[im 46/92]
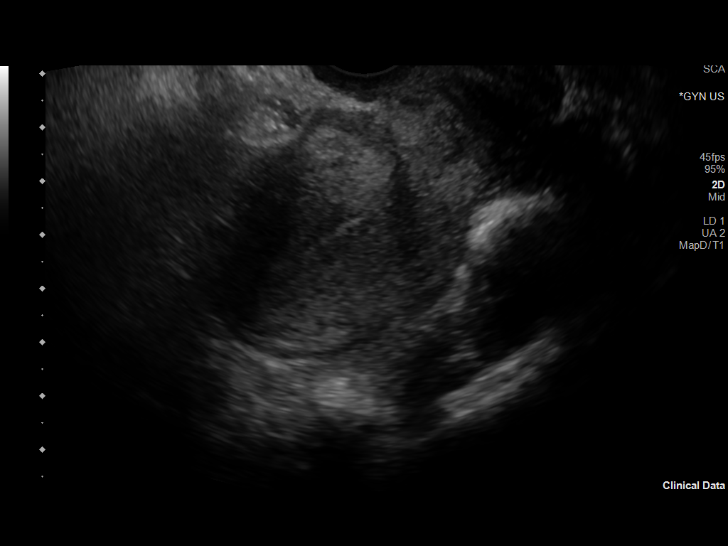
[im 54/92]
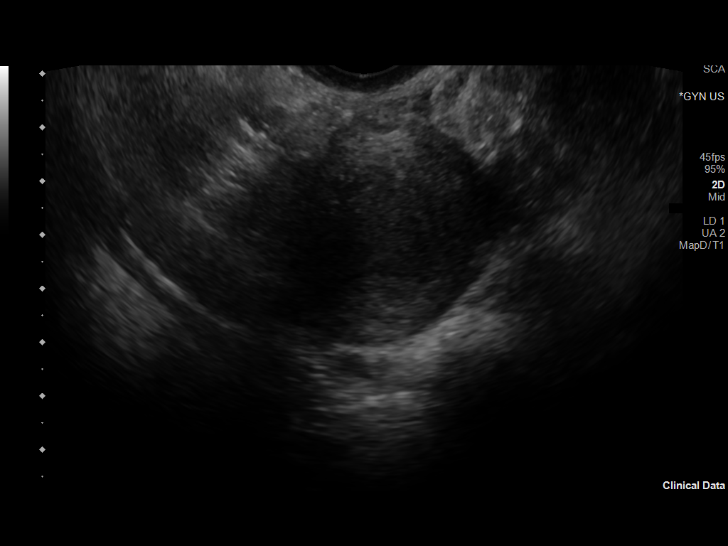
[im 61/92]
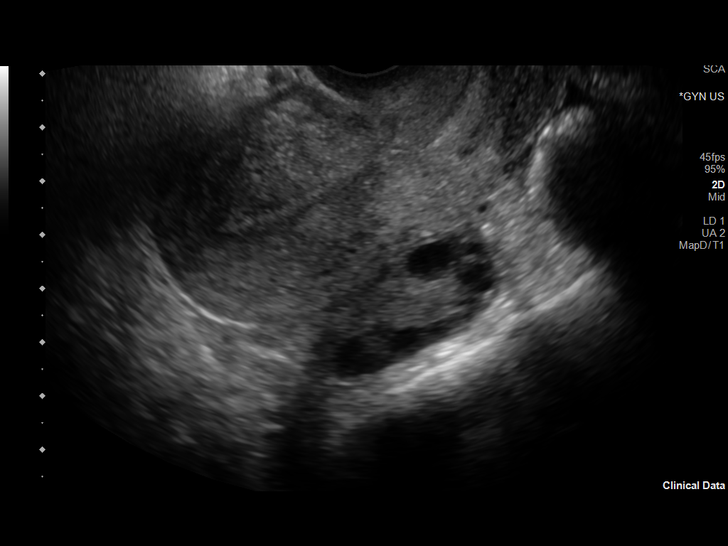
[im 69/92]
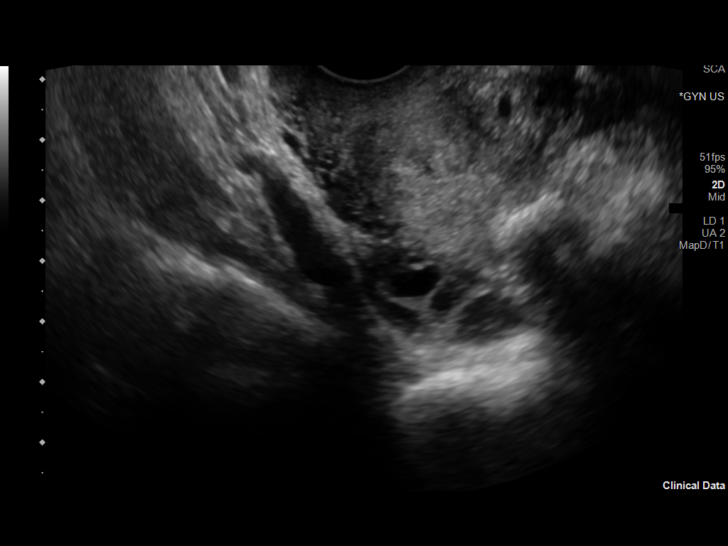
[im 76/92]
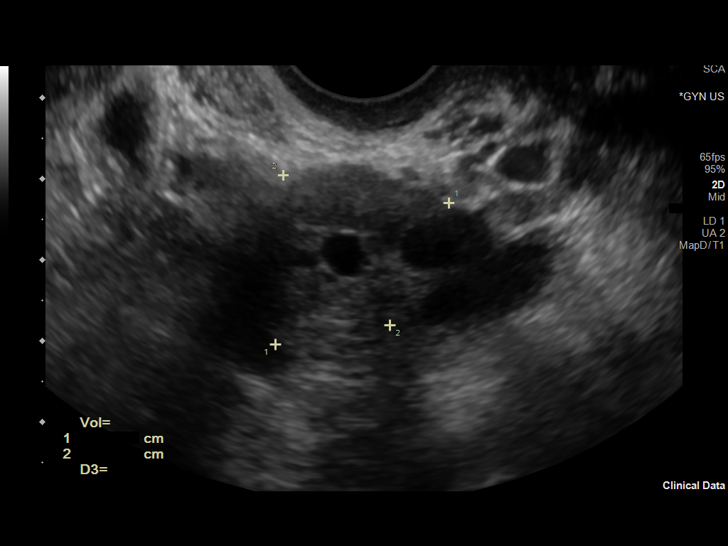
[im 84/92]
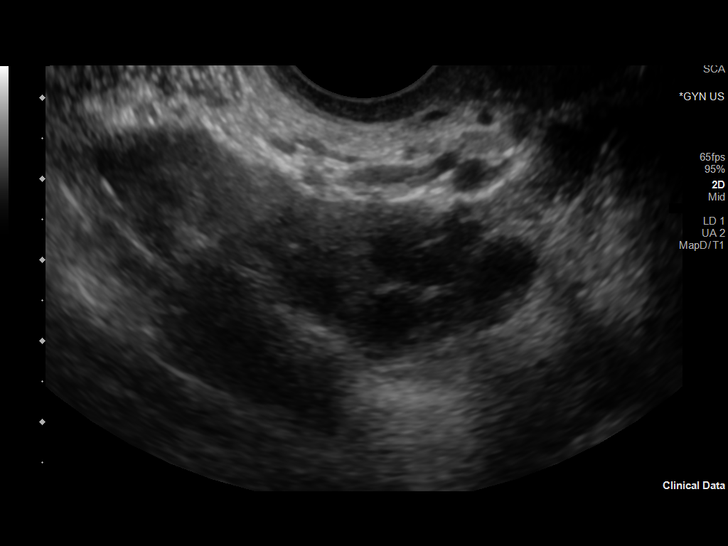
[im 92/92]
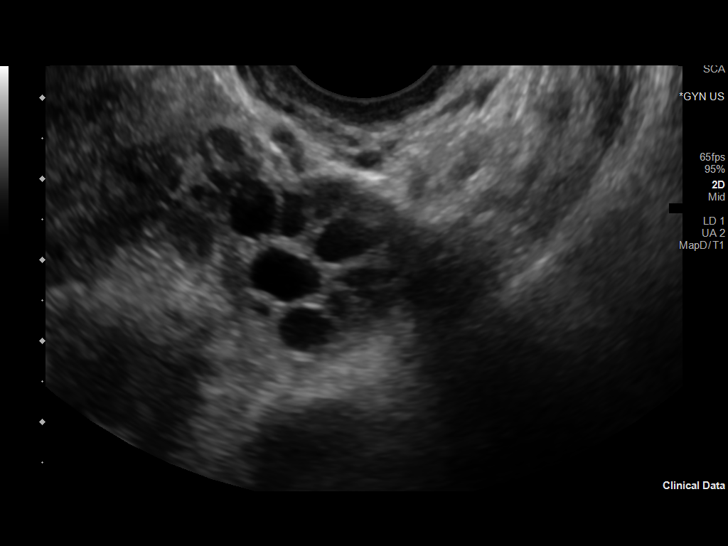

[13 of 25 positions shown; findings below may reference images not displayed]

FINDINGS: Uterus

Measurements: 7.9 x 3.6 x 4.7 cm = volume: 71 mL. Anteverted. Normal
morphology without mass

Endometrium

Thickness: 3 mm.  No endometrial fluid or focal abnormality

Right ovary

Measurements: 4.2 x 2.0 x 2.6 cm = volume: 11.2 mL. Normal
morphology without mass. Internal blood flow present on color
Doppler imaging.

Left ovary

Measurements: 2.8 x 2.3 x 2.3 cm = volume: 9.2 mL. Normal morphology
without mass. Internal blood flow present on color Doppler imaging.

Pulsed Doppler evaluation of both ovaries demonstrates normal
low-resistance arterial and venous waveforms.

Other findings

No free pelvic fluid.  No adnexal masses.
IMPRESSION: Normal exam.

## 2022-01-31 ENCOUNTER — Other Ambulatory Visit (HOSPITAL_BASED_OUTPATIENT_CLINIC_OR_DEPARTMENT_OTHER): Payer: Self-pay

## 2022-01-31 ENCOUNTER — Encounter (HOSPITAL_BASED_OUTPATIENT_CLINIC_OR_DEPARTMENT_OTHER): Payer: Self-pay

## 2022-01-31 ENCOUNTER — Other Ambulatory Visit: Payer: Self-pay

## 2022-01-31 ENCOUNTER — Emergency Department (HOSPITAL_BASED_OUTPATIENT_CLINIC_OR_DEPARTMENT_OTHER)
Admission: EM | Admit: 2022-01-31 | Discharge: 2022-01-31 | Disposition: A | Discharge status: Home / Self Care | Payer: Medicaid Other | Attending: Emergency Medicine | Admitting: Emergency Medicine

## 2022-01-31 DIAGNOSIS — R1084 Generalized abdominal pain: Secondary | ICD-10-CM | POA: Insufficient documentation

## 2022-01-31 DIAGNOSIS — R197 Diarrhea, unspecified: Secondary | ICD-10-CM | POA: Diagnosis present

## 2022-01-31 LAB — URINALYSIS, ROUTINE W REFLEX MICROSCOPIC
Bilirubin Urine: NEGATIVE
Glucose, UA: NEGATIVE mg/dL
Ketones, ur: NEGATIVE mg/dL
Leukocytes,Ua: NEGATIVE
Nitrite: POSITIVE — AB
Protein, ur: NEGATIVE mg/dL
Specific Gravity, Urine: >=1.03 (ref 1.005–1.030)
pH: 5.5 (ref 5.0–8.0)

## 2022-01-31 LAB — URINALYSIS, MICROSCOPIC (REFLEX)

## 2022-01-31 LAB — PREGNANCY, URINE: Preg Test, Ur: NEGATIVE

## 2022-01-31 MED ORDER — DICYCLOMINE HCL 20 MG PO TABS
20.0000 mg | ORAL_TABLET | Freq: Two times a day (BID) | ORAL | 0 refills | Status: DC
  Filled 2022-01-31: qty 20, 10d supply, fill #0

## 2022-01-31 MED ORDER — ONDANSETRON HCL 4 MG PO TABS
4.0000 mg | ORAL_TABLET | Freq: Four times a day (QID) | ORAL | 0 refills | Status: AC
  Filled 2022-01-31: qty 12, 3d supply, fill #0

## 2022-01-31 MED ORDER — DICYCLOMINE HCL 10 MG PO CAPS
10.0000 mg | ORAL_CAPSULE | Freq: Once | ORAL | Status: AC
  Administered 2022-01-31: 10 mg via ORAL
  Filled 2022-01-31: qty 1

## 2022-01-31 MED ORDER — SODIUM CHLORIDE 0.9 % IV BOLUS
1000.0000 mL | Freq: Once | INTRAVENOUS | Status: DC
Start: 2022-01-31 — End: 2022-01-31

## 2022-01-31 MED ORDER — ONDANSETRON 4 MG PO TBDP
4.0000 mg | ORAL_TABLET | Freq: Once | ORAL | Status: AC
Start: 2022-01-31 — End: 2022-01-31
  Administered 2022-01-31: 4 mg via ORAL
  Filled 2022-01-31: qty 1

## 2022-01-31 NOTE — ED Triage Notes (Signed)
Pt reports sever abdominal cramping since Sunday night after eating left over fried fish.  diarrhea last night. No vomiting ?

## 2022-01-31 NOTE — ED Provider Notes (Addendum)
?[facility] ?Provider Note ? ? ?CSN: [id number] ?Arrival date & time: 01/31/22  9767 ? ?  ? ?History ? ?Chief Complaint  ?Patient presents with  ? Abdominal Pain  ? ? ?Tricia Walter is Walter 31 y.o. female. ? ?The history is provided by the patient.  ?Abdominal Pain ?Pain location:  Generalized ?Pain quality: cramping   ?Pain radiates to:  Does not radiate ?Pain severity:  Mild ?Onset quality:  Gradual ?Duration:  3 days ?Timing:  Intermittent ?Progression:  Waxing and waning ?Chronicity:  New ?Context: suspicious food intake   ?Relieved by:  Nothing ?Worsened by:  Nothing ?Associated symptoms: diarrhea   ?Associated symptoms: no anorexia, no belching, no chest pain, no chills, no constipation, no cough, no dysuria, no fatigue, no fever, no flatus, no hematuria, no melena, no nausea, no shortness of breath, no sore throat, no vaginal bleeding, no vaginal discharge and no vomiting   ?Risk factors: has not had multiple surgeries   ? ?  ? ?Home Medications ?Prior to Admission medications   ?Medication Sig Start Date End Date Taking? Authorizing Provider  ?dicyclomine (BENTYL) 20 MG tablet Take 1 tablet (20 mg total) by mouth 2 (two) times daily. 01/31/22  Yes Tricia Qadri, DO  ?ondansetron (ZOFRAN) 4 MG tablet Take 1 tablet (4 mg total) by mouth every 6 (six) hours. 01/31/22  Yes Tricia Radziewicz, DO  ?acetaminophen (TYLENOL) 500 MG tablet Take 1 tablet (500 mg total) by mouth every 6 (six) hours as needed. ?Patient not taking: Reported on 07/13/2020 07/03/19   Tricia Perna A, PA-C  ?amoxicillin-clavulanate (AUGMENTIN) 875-125 MG tablet Take 1 tablet by mouth 2 (two) times daily. One po bid x 7 days 12/18/20   Tricia Etienne, DO  ?Blood Pressure Monitoring (BLOOD PRESSURE KIT) DEVI 1 kit by Does not apply route as needed. ?Patient not taking: Reported on 07/13/2020 06/17/20   Tricia Mode, MD  ?metroNIDAZOLE (FLAGYL) 500 MG tablet Take 1 tablet (500 mg total) by mouth 2 (two) times daily. 03/08/21    Henderly, Britni A, PA-C  ?nitrofurantoin, macrocrystal-monohydrate, (MACROBID) 100 MG capsule Take 1 capsule (100 mg total) by mouth 2 (two) times daily. 06/29/20   Tricia Mode, MD  ?Prenatal Vit-Fe Fumarate-FA (PRENATAL COMPLETE) 14-0.4 MG TABS Take 1 tablet by mouth daily. 06/19/20   Tricia Basil, MD  ?   ? ?Allergies    ?Patient has no known allergies.   ? ?Review of Systems   ?Review of Systems  ?Constitutional:  Negative for chills, fatigue and fever.  ?HENT:  Negative for sore throat.   ?Respiratory:  Negative for cough and shortness of breath.   ?Cardiovascular:  Negative for chest pain.  ?Gastrointestinal:  Positive for abdominal pain and diarrhea. Negative for anorexia, constipation, flatus, melena, nausea and vomiting.  ?Genitourinary:  Negative for dysuria, hematuria, vaginal bleeding and vaginal discharge.  ? ?Physical Exam ?Updated Vital Signs ?BP 132/73 (BP Location: Right Arm)   Pulse 70   Temp 98.7 ?F (37.1 ?C) (Oral)   Resp 16   Ht _0  (1.6 m)   Wt 73.5 kg   LMP 01/03/2022   SpO2 100%   BMI 28.70 kg/m?  ?Physical Exam ?Vitals and nursing note reviewed.  ?Constitutional:   ?   General: She is not in acute distress. ?   Appearance: She is well-developed. She is not ill-appearing.  ?HENT:  ?   Head: Normocephalic and atraumatic.  ?   Nose: Nose normal.  ?  Mouth/Throat:  ?   Mouth: Mucous membranes are moist.  ?Eyes:  ?   Extraocular Movements: Extraocular movements intact.  ?   Conjunctiva/sclera: Conjunctivae normal.  ?   Pupils: Pupils are equal, round, and reactive to light.  ?Cardiovascular:  ?   Rate and Rhythm: Normal rate and regular rhythm.  ?   Pulses: Normal pulses.  ?   Heart sounds: Normal heart sounds. No murmur heard. ?Pulmonary:  ?   Effort: Pulmonary effort is normal. No respiratory distress.  ?   Breath sounds: Normal breath sounds.  ?Abdominal:  ?   General: Abdomen is flat.  ?   Palpations: Abdomen is soft.  ?   Tenderness: There is no abdominal tenderness.   ?Musculoskeletal:     ?   General: No swelling.  ?   Cervical back: Normal range of motion and neck supple.  ?Skin: ?   General: Skin is warm and dry.  ?   Capillary Refill: Capillary refill takes less than 2 seconds.  ?Neurological:  ?   General: No focal deficit present.  ?   Mental Status: She is alert.  ?Psychiatric:     ?   Mood and Affect: Mood normal.  ? ? ?ED Results / Procedures / Treatments   ?Labs ?(all labs ordered are listed, but only abnormal results are displayed) ?Labs Reviewed  ?PREGNANCY, URINE  ?URINALYSIS, ROUTINE W REFLEX MICROSCOPIC  ? ? ?EKG ?None ? ?Radiology ?No results found. ? ?Procedures ?Procedures  ? ? ?Medications Ordered in ED ?Medications  ?ondansetron (ZOFRAN-ODT) disintegrating tablet 4 mg (has no administration in time range)  ?dicyclomine (BENTYL) capsule 10 mg (has no administration in time range)  ? ? ?ED Course/ Medical Decision Making/ Walter&P ?  ?                        ?Medical Decision Making ?Amount and/or Complexity of Data Reviewed ?Labs: ordered. ? ?Risk ?Prescription drug management. ? ? ?Tricia Walter is here with diarrhea, abdominal cramping for the last 2 to 3 days.  This occurred after eating some fried fish.  Normal vitals.  No fever.  She has not had any issues eating and drinking.  She states that she has been able to stay well-hydrated.  Was concerned maybe she needed antibiotic for this.  No abdominal tenderness on exam.  Overall appears very well.  Pregnancy test negative.  Overall suspect that this is food poisoning.  Symptoms seem to be improving.  Suspect that this will be self resolving.  Will provide Zofran and Bentyl as needed for symptomatic support.  Urinalysis overall appears to be more consistent with dirty catch.  She is not having any urinary symptoms.  I have no concern for dehydration or other acute process.  She understands return precautions and discharged from the ED in good condition. ? ? ?Final Clinical Impression(s) / ED Diagnoses ?Final  diagnoses:  ?Diarrhea, unspecified type  ? ? ?Rx / DC Orders ?ED Discharge Orders   ? ?      Ordered  ?  ondansetron (ZOFRAN) 4 MG tablet  Every 6 hours       ? 01/31/22 0742  ?  dicyclomine (BENTYL) 20 MG tablet  2 times daily       ? 01/31/22 0742  ? ?  ?  ? ?  ? ? ?  ?Lennice Sites, DO ?01/31/22 4360 ? ?  ?Lennice Sites, DO ?01/31/22 6770 ? ?

## 2023-02-05 ENCOUNTER — Emergency Department (HOSPITAL_COMMUNITY)
Admission: EM | Admit: 2023-02-05 | Discharge: 2023-02-05 | Disposition: A | Discharge status: Home / Self Care | Payer: Medicaid Other | Attending: Emergency Medicine | Admitting: Emergency Medicine

## 2023-02-05 ENCOUNTER — Emergency Department (HOSPITAL_COMMUNITY): Payer: Medicaid Other

## 2023-02-05 ENCOUNTER — Other Ambulatory Visit: Payer: Self-pay

## 2023-02-05 ENCOUNTER — Encounter (HOSPITAL_COMMUNITY): Payer: Self-pay

## 2023-02-05 DIAGNOSIS — O200 Threatened abortion: Secondary | ICD-10-CM

## 2023-02-05 DIAGNOSIS — Z3A00 Weeks of gestation of pregnancy not specified: Secondary | ICD-10-CM | POA: Insufficient documentation

## 2023-02-05 DIAGNOSIS — O209 Hemorrhage in early pregnancy, unspecified: Secondary | ICD-10-CM | POA: Diagnosis present

## 2023-02-05 LAB — BASIC METABOLIC PANEL
Anion gap: 6 (ref 5–15)
BUN: 8 mg/dL (ref 6–20)
CO2: 25 mmol/L (ref 22–32)
Calcium: 8.7 mg/dL — ABNORMAL LOW (ref 8.9–10.3)
Chloride: 106 mmol/L (ref 98–111)
Creatinine, Ser: 0.89 mg/dL (ref 0.44–1.00)
GFR, Estimated: >60 mL/min (ref >60)
Glucose, Bld: 93 mg/dL (ref 70–99)
Potassium: 3.6 mmol/L (ref 3.5–5.1)
Sodium: 137 mmol/L (ref 135–145)

## 2023-02-05 LAB — CBC WITH DIFFERENTIAL/PLATELET
Abs Immature Granulocytes: 0.01 10*3/uL (ref 0.00–0.07)
Basophils Absolute: 0 10*3/uL (ref 0.0–0.1)
Basophils Relative: 1 %
Eosinophils Absolute: 0 10*3/uL (ref 0.0–0.5)
Eosinophils Relative: 1 %
HCT: 37.5 % (ref 36.0–46.0)
Hemoglobin: 12.6 g/dL (ref 12.0–15.0)
Immature Granulocytes: 0 %
Lymphocytes Relative: 47 %
Lymphs Abs: 2.6 10*3/uL (ref 0.7–4.0)
MCH: 31.3 pg (ref 26.0–34.0)
MCHC: 33.6 g/dL (ref 30.0–36.0)
MCV: 93.3 fL (ref 80.0–100.0)
Monocytes Absolute: 0.5 10*3/uL (ref 0.1–1.0)
Monocytes Relative: 9 %
Neutro Abs: 2.3 10*3/uL (ref 1.7–7.7)
Neutrophils Relative %: 42 %
Platelets: 223 10*3/uL (ref 150–400)
RBC: 4.02 MIL/uL (ref 3.87–5.11)
RDW: 13.6 % (ref 11.5–15.5)
WBC: 5.4 10*3/uL (ref 4.0–10.5)
nRBC: 0 % (ref 0.0–0.2)

## 2023-02-05 LAB — HIV ANTIBODY (ROUTINE TESTING W REFLEX): HIV Screen 4th Generation wRfx: NONREACTIVE

## 2023-02-05 LAB — I-STAT BETA HCG BLOOD, ED (MC, WL, AP ONLY): I-stat hCG, quantitative: 733.3 m[IU]/mL — ABNORMAL HIGH (ref <5)

## 2023-02-05 LAB — HCG, QUANTITATIVE, PREGNANCY: hCG, Beta Chain, Quant, S: 863 m[IU]/mL — ABNORMAL HIGH (ref <5)

## 2023-02-05 MED ORDER — ACETAMINOPHEN 325 MG PO TABS
650.0000 mg | ORAL_TABLET | Freq: Once | ORAL | Status: AC
  Administered 2023-02-05: 650 mg via ORAL
  Filled 2023-02-05: qty 2

## 2023-02-05 NOTE — ED Provider Notes (Signed)
[place] EMERGENCY DEPARTMENT AT [facility] Provider Note   CSN: [id number] Arrival date & time: 02/05/23  1041     History {Add pertinent medical, surgical, social history, OB history to HPI:1} Chief Complaint  Patient presents with  . Vaginal Bleeding    Tricia Walter is a 32 y.o. female.   Vaginal Bleeding  Pt is g3p2  LMP was in March.  Home pregnancy test was a couple of weeks ago.  Pt started having vaginal bleeding yesterday.  Pt has been abdominal cramping in her lower abdomen.  She took some ibuprofen yesterday.  Pt had not seen any OB GYN  Home Medications Prior to Admission medications   Medication Sig Start Date End Date Taking? Authorizing Provider  acetaminophen (TYLENOL) 500 MG tablet Take 1 tablet (500 mg total) by mouth every 6 (six) hours as needed. Patient not taking: Reported on 07/13/2020 07/03/19   Michela Pitcher A, PA-C  amoxicillin-clavulanate (AUGMENTIN) 875-125 MG tablet Take 1 tablet by mouth 2 (two) times daily. One po bid x 7 days 12/18/20   Melene Plan, DO  Blood Pressure Monitoring (BLOOD PRESSURE KIT) DEVI 1 kit by Does not apply route as needed. Patient not taking: Reported on 07/13/2020 06/17/20   Adam Phenix, MD  dicyclomine (BENTYL) 20 MG tablet Take 1 tablet (20 mg total) by mouth 2 (two) times daily. 01/31/22   Curatolo, Adam, DO  metroNIDAZOLE (FLAGYL) 500 MG tablet Take 1 tablet (500 mg total) by mouth 2 (two) times daily. 03/08/21   Henderly, Britni A, PA-C  nitrofurantoin, macrocrystal-monohydrate, (MACROBID) 100 MG capsule Take 1 capsule (100 mg total) by mouth 2 (two) times daily. 06/29/20   Adam Phenix, MD  ondansetron (ZOFRAN) 4 MG tablet Take 1 tablet (4 mg total) by mouth every 6 (six) hours. 01/31/22   Curatolo, Adam, DO  Prenatal Vit-Fe Fumarate-FA (PRENATAL COMPLETE) 14-0.4 MG TABS Take 1 tablet by mouth daily. 06/19/20   Warden Fillers, MD      Allergies    Patient has no known allergies.    Review of Systems    Review of Systems  Genitourinary:  Positive for vaginal bleeding.    Physical Exam Updated Vital Signs BP 104/64 (BP Location: Left Arm)   Pulse 91   Temp 98.7 F (37.1 C) (Oral)   Resp 16   Wt 73 kg   SpO2 97%   BMI 28.51 kg/m  Physical Exam Vitals and nursing note reviewed.  Constitutional:      General: She is not in acute distress.    Appearance: She is well-developed.  HENT:     Head: Normocephalic and atraumatic.     Right Ear: External ear normal.     Left Ear: External ear normal.  Eyes:     General: No scleral icterus.       Right eye: No discharge.        Left eye: No discharge.     Conjunctiva/sclera: Conjunctivae normal.  Neck:     Trachea: No tracheal deviation.  Cardiovascular:     Rate and Rhythm: Normal rate and regular rhythm.  Pulmonary:     Effort: Pulmonary effort is normal. No respiratory distress.     Breath sounds: Normal breath sounds. No stridor. No wheezing or rales.  Abdominal:     General: Bowel sounds are normal. There is no distension.     Palpations: Abdomen is soft.     Tenderness: There is abdominal tenderness in the suprapubic area.  There is no guarding or rebound.  Genitourinary:    Vagina: No signs of injury.     Cervix: Cervical bleeding present. No cervical motion tenderness.  Musculoskeletal:        General: No tenderness or deformity.     Cervical back: Neck supple.  Skin:    General: Skin is warm and dry.     Findings: No rash.  Neurological:     General: No focal deficit present.     Mental Status: She is alert.     Cranial Nerves: No cranial nerve deficit, dysarthria or facial asymmetry.     Sensory: No sensory deficit.     Motor: No abnormal muscle tone or seizure activity.     Coordination: Coordination normal.  Psychiatric:        Mood and Affect: Mood normal.    ED Results / Procedures / Treatments   Labs (all labs ordered are listed, but only abnormal results are displayed) Labs Reviewed - No data to  display  EKG None  Radiology No results found.  Procedures Procedures  {Document cardiac monitor, telemetry assessment procedure when appropriate:1}  Medications Ordered in ED Medications - No data to display  ED Course/ Medical Decision Making/ A&P Clinical Course as of 02/05/23 1402  Mon Feb 05, 2023  1250 I-Stat beta hCG blood, ED(!) Elevated, c/w pregnancy [JK]  1251 CBC with Differential nl [JK]    Clinical Course User Index [JK] Linwood Dibbles, MD   {   Click here for ABCD2, HEART and other calculatorsREFRESH Note before signing :1}                          Medical Decision Making Amount and/or Complexity of Data Reviewed Labs: ordered. Decision-making details documented in ED Course. Radiology: ordered.  Risk OTC drugs.   ***  {Document critical care time when appropriate:1} {Document review of labs and clinical decision tools ie heart score, Chads2Vasc2 etc:1}  {Document your independent review of radiology images, and any outside records:1} {Document your discussion with family members, caretakers, and with consultants:1} {Document social determinants of health affecting pt's care:1} {Document your decision making why or why not admission, treatments were needed:1} Final Clinical Impression(s) / ED Diagnoses Final diagnoses:  None    Rx / DC Orders ED Discharge Orders     None

## 2023-02-05 NOTE — Discharge Instructions (Addendum)
Your ultrasound did not show the location of your pregnancy.  It is possible you are having a miscarriage but we will need to have your HCG level rechecked to see if it is increasing or decreasing.  Follow up with your OB GYN doctor.  Call today to set up a follow up appointment. return to the [facility] if you are not able to arrange follow up with your doctor.  Return to emergency room if you start having problems with severe pain, lightheadedness or other concerning symptoms

## 2023-02-05 NOTE — ED Triage Notes (Signed)
C/o vaginal bleeding, diarrhea, and lower abd pain described as cramping and tender with palpitation.  1 tampon every 2 hours.  Pt reports + pregnancy test two weeks ago.  G3P1.

## 2023-02-06 LAB — GC/CHLAMYDIA PROBE AMP (~~LOC~~) NOT AT ARMC
Chlamydia: NEGATIVE
Comment: NEGATIVE
Comment: NORMAL
Neisseria Gonorrhea: NEGATIVE

## 2023-02-06 LAB — RPR: RPR Ser Ql: NONREACTIVE

## 2023-02-13 ENCOUNTER — Inpatient Hospital Stay (HOSPITAL_COMMUNITY): Payer: Medicaid Other

## 2023-02-13 ENCOUNTER — Encounter (HOSPITAL_COMMUNITY): Payer: Self-pay | Admitting: *Deleted

## 2023-02-13 ENCOUNTER — Inpatient Hospital Stay (HOSPITAL_COMMUNITY)
Admission: AD | Admit: 2023-02-13 | Discharge: 2023-02-13 | Disposition: A | Discharge status: Home / Self Care | Payer: Medicaid Other | Attending: Family Medicine | Admitting: Family Medicine

## 2023-02-13 DIAGNOSIS — Z3201 Encounter for pregnancy test, result positive: Secondary | ICD-10-CM

## 2023-02-13 DIAGNOSIS — Z6790 Unspecified blood type, Rh positive: Secondary | ICD-10-CM

## 2023-02-13 DIAGNOSIS — Z3A08 8 weeks gestation of pregnancy: Secondary | ICD-10-CM | POA: Diagnosis not present

## 2023-02-13 DIAGNOSIS — O3680X0 Pregnancy with inconclusive fetal viability, not applicable or unspecified: Secondary | ICD-10-CM | POA: Diagnosis not present

## 2023-02-13 DIAGNOSIS — R109 Unspecified abdominal pain: Secondary | ICD-10-CM | POA: Diagnosis present

## 2023-02-13 DIAGNOSIS — O039 Complete or unspecified spontaneous abortion without complication: Secondary | ICD-10-CM

## 2023-02-13 LAB — ABO/RH: ABO/RH(D): O POS

## 2023-02-13 LAB — HCG, QUANTITATIVE, PREGNANCY: hCG, Beta Chain, Quant, S: 40 m[IU]/mL — ABNORMAL HIGH (ref <5)

## 2023-02-13 NOTE — MAU Note (Signed)
.  Tricia Walter is a 32 y.o. at Unknown here in MAU reporting: Was seen in [place] long 1 week ago for abd pain and bleeding . Had had a positive HPT. The U/S did not see anything. Pt stated her bleeding and pain have stopped. Here to follow up.  LMP: 12/17/22 Onset of complaint: last week Pain score: 0 Vitals:   02/13/23 0905  BP: 102/68  Pulse: 68  Resp: 18  Temp: 98.5 F (36.9 C)     FHT:n/a Lab orders placed from triage:

## 2023-02-13 NOTE — MAU Provider Note (Incomplete)
History     CSN: [id number]  Arrival date and time: 02/13/23 0850   Event Date/Time   First Provider Initiated Contact with Patient 02/13/23 [phone]      Chief Complaint  Patient presents with   Abdominal Pain   Vaginal Discharge   Abdominal Pain Pertinent negatives include no diarrhea, dysuria, fever, nausea or vomiting.  Vaginal Discharge The patient's primary symptoms include vaginal discharge. Associated symptoms include abdominal pain. Pertinent negatives include no back pain, chills, diarrhea, dysuria, fever, flank pain, nausea, rash, sore throat or vomiting.   Patient is 32 y.o. [id number] [redacted]w[redacted]d here with complaints of bleeding and possible miscarriage. Was seen at [facility] ED on 5/20 fro threatened miscarriage. She sees [facility] in [place] for OB/GYN care.  She reports heavy bleeding at that time. She reports the bleeding stopped about 2d ago. The bleeding was slightly heavier than her normal period. She reports no abdominal pain today. She denies dizziness and lightheadedness. She is has an [place] that did not shown an IUP on 5/20  Denies bleeding today, vaginal discharge and abdominal pain today.    OB History     Gravida  4   Para  2   Term  1   Preterm  1   AB  1   Living  1      SAB      IAB  1   Ectopic      Multiple      Live Births  2           Past Medical History:  Diagnosis Date   Anemia    GERD (gastroesophageal reflux disease)    Infection    UTI   Migraine     Past Surgical History:  Procedure Laterality Date   CESAREAN SECTION      Family History  Problem Relation Age of Onset   Colon cancer Mother    COPD Mother    Hypertension Mother    Lung cancer Father     Social History   Tobacco Use   Smoking status: Never   Smokeless tobacco: Never  Vaping Use   Vaping Use: Never used  Substance Use Topics   Alcohol use: Not Currently    Comment: occ   Drug use: No    Allergies: No Known Allergies  Facility-Administered  Medications Prior to Admission  Medication Dose Route Frequency Provider Last Rate Last Admin   ferumoxytol (FERAHEME) 510 mg in sodium chloride 0.9 % 100 mL IVPB  510 mg Intravenous Once Constant, Peggy, MD       Medications Prior to Admission  Medication Sig Dispense Refill Last Dose   acetaminophen (TYLENOL) 500 MG tablet Take 1 tablet (500 mg total) by mouth every 6 (six) hours as needed. (Patient not taking: Reported on 07/13/2020) 30 tablet 0    amoxicillin-clavulanate (AUGMENTIN) 875-125 MG tablet Take 1 tablet by mouth 2 (two) times daily. One po bid x 7 days (Patient not taking: Reported on 02/13/2023) 14 tablet 0 Not Taking   Blood Pressure Monitoring (BLOOD PRESSURE KIT) DEVI 1 kit by Does not apply route as needed. (Patient not taking: Reported on 07/13/2020) 1 each 0    dicyclomine (BENTYL) 20 MG tablet Take 1 tablet (20 mg total) by mouth 2 (two) times daily. (Patient not taking: Reported on 02/13/2023) 20 tablet 0 Not Taking   metroNIDAZOLE (FLAGYL) 500 MG tablet Take 1 tablet (500 mg total) by mouth 2 (two) times daily. (Patient not taking: Reported  on 02/13/2023) 14 tablet 0 Not Taking   nitrofurantoin, macrocrystal-monohydrate, (MACROBID) 100 MG capsule Take 1 capsule (100 mg total) by mouth 2 (two) times daily. (Patient not taking: Reported on 02/13/2023) 14 capsule 0 Not Taking   ondansetron (ZOFRAN) 4 MG tablet Take 1 tablet (4 mg total) by mouth every 6 (six) hours. 12 tablet 0    Prenatal Vit-Fe Fumarate-FA (PRENATAL COMPLETE) 14-0.4 MG TABS Take 1 tablet by mouth daily. (Patient not taking: Reported on 02/13/2023) 60 tablet 3 Not Taking    Review of Systems  Constitutional:  Negative for chills and fever.  HENT:  Negative for congestion and sore throat.   Eyes:  Negative for pain and visual disturbance.  Respiratory:  Negative for cough, chest tightness and shortness of breath.   Cardiovascular:  Negative for chest pain.  Gastrointestinal:  Positive for abdominal pain.  Negative for diarrhea, nausea and vomiting.  Endocrine: Negative for cold intolerance and heat intolerance.  Genitourinary:  Positive for vaginal bleeding (last was 2d ago) and vaginal discharge. Negative for dysuria and flank pain.  Musculoskeletal:  Negative for back pain.  Skin:  Negative for rash.  Allergic/Immunologic: Negative for food allergies.  Neurological:  Negative for dizziness and light-headedness.  Psychiatric/Behavioral:  Negative for agitation.    Physical Exam   Blood pressure 102/68, pulse 68, temperature 98.5 F (36.9 C), resp. rate 18, height 5\' 3"  (1.6 m), weight 73.9 kg, last menstrual period 12/17/2022, unknown if currently breastfeeding.  Physical Exam Vitals and nursing note reviewed.  Constitutional:      General: She is not in acute distress.    Appearance: She is well-developed.  HENT:     Head: Normocephalic and atraumatic.  Eyes:     General: No scleral icterus.    Conjunctiva/sclera: Conjunctivae normal.  Cardiovascular:     Rate and Rhythm: Normal rate.  Pulmonary:     Effort: Pulmonary effort is normal.  Chest:     Chest wall: No tenderness.  Abdominal:     Palpations: Abdomen is soft.     Tenderness: There is no abdominal tenderness. There is no guarding or rebound.  Genitourinary:    Vagina: Normal.  Musculoskeletal:        General: Normal range of motion.     Cervical back: Normal range of motion and neck supple.  Skin:    General: Skin is warm and dry.     Findings: No rash.  Neurological:     Mental Status: She is alert and oriented to person, place, and time.     MAU Course  Procedures  MDM: {Desc; low/moderate/high:110033}  This patient presents to the ED for concern of   Chief Complaint  Patient presents with   Abdominal Pain   Vaginal Discharge     These complains involves an extensive number of treatment options, and is a complaint that carries with it a high risk of complications and morbidity.  The differential  diagnosis for  1. Vaginal bleeding in early pregnancy INCLUDES miscarriage, ectopic, normal variant with viable IUP> currently has pregnancy of unknown location and bhcg needs to be follow to zero  Co morbidities that complicate the patient evaluation:  External records from outside source obtained and reviewed including CareEverywhere and [place] EMR  Lab Tests:  Orders Placed This Encounter  Procedures   hCG, quantitative, pregnancy    Standing Status:   Standing    Number of Occurrences:   1   ABO/Rh    Standing Status:  Standing    Number of Occurrences:   1    I ordered, and personally interpreted labs.  The pertinent results include:  ***  Imaging Studies ordered:  I ordered imaging studies includingTransvaginal [place] I independently visualized and interpreted imaging which showed *** I agree with the radiologist interpretation  Cardiac Testing/Monitoring:  EKG {WAS/WAS NOT:[phone]::"was not"} ordered today. I personally reviewed or consulted with a physician to help with intrepretation.   *** if patient was on continuous monitoring document here  Medicines ordered and prescription drug management:  Medications:    Reevaluation of the patient after these medicines showed that the patient improved I have reviewed the patients home medicines and have made adjustments as needed  Test Considered: ***  Critical Interventions: {Critical interventions MAU:28825}  Consultations Obtained:  I requested consultation with the ***,  and discussed lab and imaging findings as well as pertinent plan - they recommend: ***  MAU Course:  11:41 AM RN informed me that [place] has not been done but patient does need to go. Discussed in triage room that beta HCG had dropped consistent with pregnancy loss.***    After the interventions noted above, I reevaluated the patient and found that they have :improved  Dispostion: {Obs/admit:60881}   Assessment and Plan  ***  Federico Flake 02/13/2023, 9:10 AM

## 2023-02-13 NOTE — Discharge Instructions (Signed)
Your pregnancy hormone has dropped and this is consistent with a miscarriage  You need to have your pregnancy hormone followed until it is zero since there was never confirmation that your pregnancy was in your uterus.   This follow up can be done at your primary OB group

## 2023-03-13 ENCOUNTER — Other Ambulatory Visit (HOSPITAL_COMMUNITY): Payer: Self-pay

## 2023-03-26 ENCOUNTER — Other Ambulatory Visit: Payer: Self-pay

## 2023-07-28 ENCOUNTER — Other Ambulatory Visit: Payer: Self-pay

## 2023-07-28 ENCOUNTER — Encounter (HOSPITAL_COMMUNITY): Payer: Self-pay

## 2023-07-28 ENCOUNTER — Emergency Department (HOSPITAL_COMMUNITY)
Admission: EM | Admit: 2023-07-28 | Discharge: 2023-07-28 | Disposition: A | Discharge status: Home / Self Care | Payer: Medicaid Other | Attending: Emergency Medicine | Admitting: Emergency Medicine

## 2023-07-28 DIAGNOSIS — K0889 Other specified disorders of teeth and supporting structures: Secondary | ICD-10-CM | POA: Diagnosis present

## 2023-07-28 DIAGNOSIS — K0381 Cracked tooth: Secondary | ICD-10-CM | POA: Diagnosis not present

## 2023-07-28 DIAGNOSIS — K029 Dental caries, unspecified: Secondary | ICD-10-CM | POA: Diagnosis not present

## 2023-07-28 MED ORDER — AMOXICILLIN 500 MG PO CAPS
500.0000 mg | ORAL_CAPSULE | Freq: Once | ORAL | Status: AC
  Administered 2023-07-28: 500 mg via ORAL
  Filled 2023-07-28: qty 1

## 2023-07-28 MED ORDER — IBUPROFEN 400 MG PO TABS
400.0000 mg | ORAL_TABLET | Freq: Once | ORAL | Status: AC
  Administered 2023-07-28: 400 mg via ORAL
  Filled 2023-07-28: qty 1

## 2023-07-28 NOTE — Discharge Instructions (Signed)
Your pain is likely due to dental infection.  Please take antibiotic recently prescribed as treatment of your symptoms.  Take ibuprofen as needed for pain and swelling.  Follow-up closely with your dentist as scheduled for further care.

## 2023-07-28 NOTE — ED Provider Notes (Signed)
[place] EMERGENCY DEPARTMENT AT [facility] Provider Note   CSN: [id number] Arrival date & time: 07/28/23  4259     History  Chief Complaint  Patient presents with   Dental Pain    Tricia Walter is a 32 y.o. female.  The history is provided by the patient and medical records. No language interpreter was used.  Dental Pain    32 year old female presenting with complaint of dental pain.  Patient reports 3 days ago she noted some pain and swelling to her right lower jaw.  She went to urgent care yesterday and was told that it could be a dental infection.  She was prescribed amoxicillin but she have not had the medication filled yet.  However today she noticed increasing pain and swelling to the affected area which concerns her.  She denies any fever no trouble chewing no neck pain no ear pain loss of hearing.  No recent injury.  She is not pregnant.  Home Medications Prior to Admission medications   Medication Sig Start Date End Date Taking? Authorizing Provider  acetaminophen (TYLENOL) 500 MG tablet Take 1 tablet (500 mg total) by mouth every 6 (six) hours as needed. Patient not taking: Reported on 07/13/2020 07/03/19   Michela Pitcher A, PA-C  ondansetron (ZOFRAN) 4 MG tablet Take 1 tablet (4 mg total) by mouth every 6 (six) hours. 01/31/22   Curatolo, Adam, DO  Prenatal Vit-Fe Fumarate-FA (PRENATAL COMPLETE) 14-0.4 MG TABS Take 1 tablet by mouth daily. Patient not taking: Reported on 02/13/2023 06/19/20   Warden Fillers, MD      Allergies    Patient has no known allergies.    Review of Systems   Review of Systems  All other systems reviewed and are negative.   Physical Exam Updated Vital Signs BP 123/89 (BP Location: Right Arm)   Pulse (!) 101   Temp 98.7 F (37.1 C)   Resp 16   Ht 5\' 3"  (1.6 m)   Wt 70.3 kg   LMP 07/12/2023 (Exact Date)   SpO2 100%   Breastfeeding Unknown   BMI 27.46 kg/m  Physical Exam Vitals and nursing note reviewed.   Constitutional:      General: She is not in acute distress.    Appearance: She is well-developed.  HENT:     Head: Atraumatic.     Mouth/Throat:     Comments: Mouth: Dental decay noted to tooth #30 with mild tenderness to palpation.  Mild adjacent facial swelling noted.  No obvious abscess amenable to drainage.  No gingivitis.  No stridor or trismus. Eyes:     Conjunctiva/sclera: Conjunctivae normal.  Neck:     Vascular: No carotid bruit.  Pulmonary:     Effort: Pulmonary effort is normal.  Musculoskeletal:     Cervical back: Normal range of motion and neck supple. No rigidity or tenderness.  Lymphadenopathy:     Cervical: No cervical adenopathy.  Skin:    Findings: No rash.  Neurological:     Mental Status: She is alert.  Psychiatric:        Mood and Affect: Mood normal.     ED Results / Procedures / Treatments   Labs (all labs ordered are listed, but only abnormal results are displayed) Labs Reviewed - No data to display  EKG None  Radiology No results found.  Procedures Procedures    Medications Ordered in ED Medications  amoxicillin (AMOXIL) capsule 500 mg (has no administration in time range)  ibuprofen (ADVIL)  tablet 400 mg (has no administration in time range)    ED Course/ Medical Decision Making/ A&P                                 Medical Decision Making  BP 123/89 (BP Location: Right Arm)   Pulse (!) 101   Temp 98.7 F (37.1 C)   Resp 16   Ht 5\' 3"  (1.6 m)   Wt 70.3 kg   LMP 07/12/2023 (Exact Date)   SpO2 100%   Breastfeeding Unknown   BMI 27.46 kg/m   97:28 AM 33 year old female presenting with complaint of dental pain.  Patient reports 3 days ago she noted some pain and swelling to her right lower jaw.  She went to urgent care yesterday and was told that it could be a dental infection.  She was prescribed amoxicillin but she have not had the medication filled yet.  However today she noticed increasing pain and swelling to the affected  area which concerns her.  She denies any fever no trouble chewing no neck pain no ear pain loss of hearing.  No recent injury.  She is not pregnant.  Exam remarkable for tenderness noted to tooth #30 with some evidence of dental decay and some agitation facial swelling suggestive of periapical abscess with facial involvement.  No clear abscess amenable for drainage.  No gingivitis no mastoiditis and no reactive lymph nodes.  Patient given a dose of amoxicillin and ibuprofen here.  Encouraged patient to fill her antibiotic that was recently prescribed and to follow-up with a dentist as she has an appointment in 2 days.  Return precaution given.        Final Clinical Impression(s) / ED Diagnoses Final diagnoses:  Pain due to dental caries    Rx / DC Orders ED Discharge Orders     None         Fayrene Helper, PA-C 07/28/23 0556    Glynn Octave, MD 07/28/23 [phone]

## 2023-07-28 NOTE — ED Triage Notes (Signed)
Pt states she has a bump between tooth and cheek on right lower side of jaw. Pt seen for same at [facility] yesterday, prescribed amoxicillin. Pt states she woke up this morning and area has doubled in size and pain has worsened.

## 2023-08-11 ENCOUNTER — Emergency Department (HOSPITAL_COMMUNITY)
Admission: EM | Admit: 2023-08-11 | Discharge: 2023-08-11 | Disposition: A | Discharge status: Home / Self Care | Payer: Medicaid Other | Attending: Emergency Medicine | Admitting: Emergency Medicine

## 2023-08-11 ENCOUNTER — Other Ambulatory Visit: Payer: Self-pay

## 2023-08-11 DIAGNOSIS — K0889 Other specified disorders of teeth and supporting structures: Secondary | ICD-10-CM | POA: Diagnosis present

## 2023-08-11 DIAGNOSIS — K047 Periapical abscess without sinus: Secondary | ICD-10-CM

## 2023-08-11 MED ORDER — AMOXICILLIN-POT CLAVULANATE 875-125 MG PO TABS: 1.0000 | ORAL_TABLET | Freq: Two times a day (BID) | ORAL | 0 refills | Status: DC

## 2023-08-11 NOTE — ED Provider Notes (Signed)
[place] EMERGENC[facility] DEPARTMENT AT [facility] Provider Note   CSN: [id number] Arrival date & time: 08/11/23  0831     History  Chief Complaint  Patient presents with   Abscess   Dental Pain    Tricia Walter is a 32 y.o. female.  32 year old female presents today for concern of dental abscess.  She states she was initially diagnosed about 3 weeks ago.  She started on amoxicillin.  Since then she has seen her dentist and is scheduled for root canal on December 20.  She states she reached out to her dentist because the swelling was getting worse after the amoxicillin course initially.  Her amoxicillin course was prolonged.  She states she reached out to her dentist a couple days ago and they said they would not drain the abscess but prolong the antibiotic course.  They are unable to see her any sooner.  She is not worried about the pain.  She states she has good pain control.  She just needs to know if this can be drained.  The history is provided by the patient. No language interpreter was used.       Home Medications Prior to Admission medications   Medication Sig Start Date End Date Taking? Authorizing Provider  amoxicillin-clavulanate (AUGMENTIN) 875-125 MG tablet Take 1 tablet by mouth every 12 (twelve) hours. 08/11/23  Yes Karie Mainland, Aiyden Lauderback, PA-C  acetaminophen (TYLENOL) 500 MG tablet Take 1 tablet (500 mg total) by mouth every 6 (six) hours as needed. Patient not taking: Reported on 07/13/2020 07/03/19   Michela Pitcher A, PA-C  ondansetron (ZOFRAN) 4 MG tablet Take 1 tablet (4 mg total) by mouth every 6 (six) hours. 01/31/22   Curatolo, Adam, DO  Prenatal Vit-Fe Fumarate-FA (PRENATAL COMPLETE) 14-0.4 MG TABS Take 1 tablet by mouth daily. Patient not taking: Reported on 02/13/2023 06/19/20   Warden Fillers, MD      Allergies    Patient has no known allergies.    Review of Systems   Review of Systems  Constitutional:  Negative for chills and fever.  HENT:  Positive for  dental problem. Negative for trouble swallowing and voice change.   All other systems reviewed and are negative.   Physical Exam Updated Vital Signs BP 114/74 (BP Location: Right Arm)   Pulse 75   Temp 98.5 F (36.9 C)   Resp 16   LMP 08/03/2023 (Exact Date)   SpO2 100%  Physical Exam Vitals and nursing note reviewed.  Constitutional:      General: She is not in acute distress.    Appearance: Normal appearance. She is not ill-appearing.  HENT:     Head: Normocephalic and atraumatic.     Nose: Nose normal.     Mouth/Throat:     Comments: Dental abscess noted in the right lower mouth.  No evidence of drainable abscess Lajoyce Corners.  No evidence of peritonsillar abscess, retropharyngeal abscess, or Ludwig's angina.  Without trismus. Eyes:     Conjunctiva/sclera: Conjunctivae normal.  Pulmonary:     Effort: Pulmonary effort is normal. No respiratory distress.  Musculoskeletal:        General: No deformity.  Skin:    Findings: No rash.  Neurological:     Mental Status: She is alert.     ED Results / Procedures / Treatments   Labs (all labs ordered are listed, but only abnormal results are displayed) Labs Reviewed - No data to display  EKG None  Radiology No results found.  Procedures Procedures    Medications Ordered in ED Medications - No data to display  ED Course/ Medical Decision Making/ A&P                                 Medical Decision Making Risk Prescription drug management.   32 year old female presents today for concern of dental abscess.  Ongoing for about 3 weeks.  She states the swelling recently worsened.  She reached out to her dentist.  They state prior to the root canal which is scheduled for 12/20 there would be no draining of the abscess and they would just prolong the antibiotic course until needed. No drainable abscess noted on exam. Will transition patient to Augmentin. On-call dental information given.  Discussed trying to schedule a  sooner appointment with her current dentist or the on-call dentist.  She is agreeable.  Return precaution discussed.  Patient voices understanding and is in agreement with plan.   Final Clinical Impression(s) / ED Diagnoses Final diagnoses:  Dental abscess    Rx / DC Orders ED Discharge Orders          Ordered    amoxicillin-clavulanate (AUGMENTIN) 875-125 MG tablet  Every 12 hours        08/11/23 0930              Marita Kansas, PA-C 08/11/23 1610    Gloris Manchester, MD 08/11/23 1622

## 2023-08-11 NOTE — Discharge Instructions (Signed)
No drainable abscess noted.  Antibiotic switched to Augmentin.  I have also given information for on-call dentist.  See if he can get a sooner appointment.  For any concerning symptoms return to the emergency room.

## 2023-08-11 NOTE — ED Triage Notes (Signed)
Pt. Stated, I've been on an antibiotic for a tooth problem and was given an antibiotic Amoxicilling 500mg  TID and is scheduled for root canal 12-20 2024, but I have some type of abscess on the lower front that goes to the back.It probably needs draining.

## 2024-01-09 ENCOUNTER — Ambulatory Visit
Admission: EM | Admit: 2024-01-09 | Discharge: 2024-01-09 | Disposition: A | Discharge status: Home / Self Care | Attending: Physician Assistant | Admitting: Physician Assistant

## 2024-01-09 ENCOUNTER — Other Ambulatory Visit: Payer: Self-pay

## 2024-01-09 ENCOUNTER — Ambulatory Visit (INDEPENDENT_AMBULATORY_CARE_PROVIDER_SITE_OTHER): Admitting: Radiology

## 2024-01-09 DIAGNOSIS — M546 Pain in thoracic spine: Secondary | ICD-10-CM

## 2024-01-09 MED ORDER — METHOCARBAMOL 750 MG PO TABS: 750.0000 mg | ORAL_TABLET | Freq: Three times a day (TID) | ORAL | 0 refills | Status: DC | PRN

## 2024-01-09 MED ORDER — MELOXICAM 7.5 MG PO TABS: 7.5000 mg | ORAL_TABLET | Freq: Every day | ORAL | 0 refills | Status: DC
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
## 2024-02-09 ENCOUNTER — Other Ambulatory Visit: Payer: Self-pay | Admitting: Medical Genetics

## 2024-02-12 ENCOUNTER — Other Ambulatory Visit (HOSPITAL_COMMUNITY)
Admission: RE | Admit: 2024-02-12 | Discharge: 2024-02-12 | Disposition: A | Discharge status: Home / Self Care | Payer: Self-pay | Source: Ambulatory Visit | Attending: Medical Genetics | Admitting: Medical Genetics

## 2024-02-12 ENCOUNTER — Other Ambulatory Visit (HOSPITAL_COMMUNITY): Payer: Self-pay

## 2024-02-17 ENCOUNTER — Ambulatory Visit
Admission: EM | Admit: 2024-02-17 | Discharge: 2024-02-17 | Disposition: A | Discharge status: Home / Self Care | Attending: Physician Assistant | Admitting: Physician Assistant

## 2024-02-17 ENCOUNTER — Other Ambulatory Visit: Payer: Self-pay

## 2024-02-17 DIAGNOSIS — K0381 Cracked tooth: Secondary | ICD-10-CM

## 2024-02-17 DIAGNOSIS — K0889 Other specified disorders of teeth and supporting structures: Secondary | ICD-10-CM

## 2024-02-17 MED ORDER — AMOXICILLIN-POT CLAVULANATE 875-125 MG PO TABS: 1.0000 | ORAL_TABLET | Freq: Two times a day (BID) | ORAL | 0 refills | Status: AC

## 2024-02-17 NOTE — ED Triage Notes (Signed)
 Pt presents with complaints of right lower tooth pain x 2 days. Pt states her mouth has been swollen on the right side. Scheduled for a root canal on the tooth. Pt currently rates her overall tooth pain a 7/10. OTC Ibuprofen  taken with no relief, last dose was this morning at 0800. 400 mg taken.

## 2024-02-17 NOTE — Discharge Instructions (Signed)
 You were seen today for concerns of dental pain due to a broken tooth on your lower right jaw.  At this time the area does not appear to be severely infected but I am going to send in an antibiotic for you to take twice per day for 7 days.  As needed you can take Tylenol  and ibuprofen  for pain. Please make sure that you are following up with your dentist for further evaluation and ongoing management.  If you start to develop swelling of the face or jaw, difficulty opening your mouth, fever or chills that are not responding to medication, severe pain, drainage that looks like pus from the area, severe bleeding please return to urgent care or go to the emergency room for further evaluation and management.

## 2024-02-17 NOTE — ED Provider Notes (Signed)
 [facility]    CSN: [id number] Arrival date & time: 02/17/24  1433      History   Chief Complaint Chief Complaint  Patient presents with   Dental Pain    HPI Tricia Walter is a 33 y.o. female.   HPI  Pt reports concerns for dental infection She states she noticed swelling to a right bottom tooth that chipped several months ago  She reports that she has reached out to her dentist and has a planned root canal on this tooth in Aug She reports that she started to have increased pain yesterday and today despite OTC pain relievers She reports that the gum around the tooth is swollen. She denies bad taste in the mouth, bleeding or signs of purulent drainage  She does admit to mild jaw pain near that tooth    Past Medical History:  Diagnosis Date   Anemia    GERD (gastroesophageal reflux disease)    Infection    UTI   Migraine     Patient Active Problem List   Diagnosis Date Noted   Anhydramnios in second trimester 07/06/2020   Anemia in pregnancy 06/19/2020   History of cesarean delivery 06/18/2020   Placenta previa 06/18/2020   Subchorionic hematoma in second trimester 06/18/2020   Vaginal bleeding before [redacted] weeks gestation 06/18/2020   Supervision of high risk pregnancy, antepartum 06/17/2020    Past Surgical History:  Procedure Laterality Date   CESAREAN SECTION      OB History     Gravida  4   Para  2   Term  1   Preterm  1   AB  1   Living  1      SAB      IAB  1   Ectopic      Multiple      Live Births  2            Home Medications    Prior to Admission medications   Medication Sig Start Date End Date Taking? Authorizing Provider  amoxicillin -clavulanate (AUGMENTIN ) 875-125 MG tablet Take 1 tablet by mouth every 12 (twelve) hours. 02/17/24  Yes Logan Vegh E, PA-C  acetaminophen  (TYLENOL ) 500 MG tablet Take 1 tablet (500 mg total) by mouth every 6 (six) hours as needed. Patient not taking: Reported on 07/13/2020  07/03/19   Fawze, Mina A, PA-C  ondansetron  (ZOFRAN ) 4 MG tablet Take 1 tablet (4 mg total) by mouth every 6 (six) hours. 01/31/22   Curatolo, Adam, DO  Prenatal Vit-Fe Fumarate-FA (PRENATAL COMPLETE) 14-0.4 MG TABS Take 1 tablet by mouth daily. Patient not taking: Reported on 02/13/2023 06/19/20   Abigail Abler, MD    Family History Family History  Problem Relation Age of Onset   Colon cancer Mother    COPD Mother    Hypertension Mother    Lung cancer Father     Social History Social History   Tobacco Use   Smoking status: Never   Smokeless tobacco: Never  Vaping Use   Vaping status: Never Used  Substance Use Topics   Alcohol use: Not Currently    Comment: occ   Drug use: No     Allergies   Patient has no known allergies.   Review of Systems Review of Systems  Constitutional:  Negative for chills and fever.  HENT:  Positive for dental problem.      Physical Exam Triage Vital Signs ED Triage Vitals  Encounter Vitals Group  BP 02/17/24 1451 127/87     Systolic BP Percentile --      Diastolic BP Percentile --      Pulse Rate 02/17/24 1451 61     Resp 02/17/24 1451 18     Temp 02/17/24 1451 98.7 F (37.1 C)     Temp Source 02/17/24 1451 Oral     SpO2 02/17/24 1451 99 %     Weight 02/17/24 1452 154 lb 15.7 oz (70.3 kg)     Height 02/17/24 1452 5\' 3"  (1.6 m)     Head Circumference --      Peak Flow --      Pain Score 02/17/24 1452 7     Pain Loc --      Pain Education --      Exclude from Growth Chart --    No data found.  Updated Vital Signs BP 127/87 (BP Location: Right Arm)   Pulse 61   Temp 98.7 F (37.1 C) (Oral)   Resp 18   Ht 5\' 3"  (1.6 m)   Wt 154 lb 15.7 oz (70.3 kg)   LMP 02/13/2024 (Exact Date)   SpO2 99%   BMI 27.45 kg/m   Visual Acuity Right Eye Distance:   Left Eye Distance:   Bilateral Distance:    Right Eye Near:   Left Eye Near:    Bilateral Near:     Physical Exam Vitals reviewed.  Constitutional:      General:  She is awake.     Appearance: Normal appearance. She is well-developed and well-groomed.  HENT:     Head: Normocephalic and atraumatic.     Mouth/Throat:     Lips: Pink.     Mouth: Mucous membranes are moist.     Dentition: Abnormal dentition. Dental tenderness present. No gingival swelling or dental abscesses.     Tongue: No lesions.   Eyes:     General: Lids are normal. Gaze aligned appropriately.     Extraocular Movements: Extraocular movements intact.     Conjunctiva/sclera: Conjunctivae normal.  Pulmonary:     Effort: Pulmonary effort is normal.  Neurological:     General: No focal deficit present.     Mental Status: She is alert and oriented to person, place, and time.     GCS: GCS eye subscore is 4. GCS verbal subscore is 5. GCS motor subscore is 6.     Cranial Nerves: No cranial nerve deficit, dysarthria or facial asymmetry.  Psychiatric:        Attention and Perception: Attention and perception normal.        Mood and Affect: Mood and affect normal.        Speech: Speech normal.        Behavior: Behavior normal. Behavior is cooperative.      UC Treatments / Results  Labs (all labs ordered are listed, but only abnormal results are displayed) Labs Reviewed - No data to display  EKG   Radiology No results found.  Procedures Procedures (including critical care time)  Medications Ordered in UC Medications - No data to display  Initial Impression / Assessment and Plan / UC Course  I have reviewed the triage vital signs and the nursing notes.  Pertinent labs & imaging results that were available during my care of the patient were reviewed by me and considered in my medical decision making (see chart for details).      Final Clinical Impressions(s) / UC Diagnoses   Final diagnoses:  Pain, dental  Broken or cracked tooth, nontraumatic   Patient presents today with concerns of dental pain.  She reports that she previously broke one of her teeth several  months ago and she is working with her dentist on a plan for root canal and crown.  Physical exam is notable for broken tooth 29 but no significant erythema or swelling of the gum surrounding the area.  No evidence of purulent drainage.  Area appears slightly tender to percussion.  Reviewed with patient that the area does not appear significantly/severely infected but her procedure for correction is not until August.  Will send in prescription for Augmentin  p.o. twice daily x 7 days to help with potential infection.  Recommend using Orajel and Biotene mouthwash as needed for further symptomatic relief.  Reviewed that she can use Tylenol  and ibuprofen  as needed for pain management.  Recommend close follow-up with dentist for ongoing management.  ED and return precautions reviewed and provided in after visit summary.  Follow-up as needed.     Discharge Instructions      You were seen today for concerns of dental pain due to a broken tooth on your lower right jaw.  At this time the area does not appear to be severely infected but I am going to send in an antibiotic for you to take twice per day for 7 days.  As needed you can take Tylenol  and ibuprofen  for pain. Please make sure that you are following up with your dentist for further evaluation and ongoing management.  If you start to develop swelling of the face or jaw, difficulty opening your mouth, fever or chills that are not responding to medication, severe pain, drainage that looks like pus from the area, severe bleeding please return to urgent care or go to the emergency room for further evaluation and management.   ED Prescriptions     Medication Sig Dispense Auth. Provider   amoxicillin -clavulanate (AUGMENTIN ) 875-125 MG tablet Take 1 tablet by mouth every 12 (twelve) hours. 14 tablet Analyssa Downs E, PA-C      PDMP not reviewed this encounter.   Akeisha Lagerquist, Pearla Bottom, PA-C 02/17/24 1552

## 2024-02-22 LAB — GENECONNECT MOLECULAR SCREEN: Genetic Analysis Overall Interpretation: NEGATIVE
